# Patient Record
Sex: Female | Born: 1970 | Race: White | Hispanic: No | Marital: Married | Smoking: Former smoker
Health system: Southern US, Community
[De-identification: ages and names within clinical notes are randomized; demographics above are authoritative.]

## PROBLEM LIST (undated history)

## (undated) DIAGNOSIS — F988 Other specified behavioral and emotional disorders with onset usually occurring in childhood and adolescence: Secondary | ICD-10-CM

## (undated) DIAGNOSIS — E785 Hyperlipidemia, unspecified: Secondary | ICD-10-CM

## (undated) DIAGNOSIS — Z9221 Personal history of antineoplastic chemotherapy: Secondary | ICD-10-CM

## (undated) DIAGNOSIS — C50919 Malignant neoplasm of unspecified site of unspecified female breast: Secondary | ICD-10-CM

## (undated) DIAGNOSIS — Z923 Personal history of irradiation: Secondary | ICD-10-CM

## (undated) DIAGNOSIS — I10 Essential (primary) hypertension: Secondary | ICD-10-CM

## (undated) HISTORY — PX: ABDOMINAL HYSTERECTOMY: SHX81

## (undated) HISTORY — DX: Malignant neoplasm of unspecified site of unspecified female breast: C50.919

## (undated) HISTORY — DX: Essential (primary) hypertension: I10

## (undated) HISTORY — DX: Hyperlipidemia, unspecified: E78.5

## (undated) HISTORY — DX: Other specified behavioral and emotional disorders with onset usually occurring in childhood and adolescence: F98.8

---

## 2008-07-25 ENCOUNTER — Ambulatory Visit: Payer: Self-pay | Admitting: Obstetrics and Gynecology

## 2008-07-31 ENCOUNTER — Ambulatory Visit: Payer: Self-pay | Admitting: Obstetrics and Gynecology

## 2008-12-07 ENCOUNTER — Ambulatory Visit: Payer: Self-pay | Admitting: Obstetrics and Gynecology

## 2008-12-19 ENCOUNTER — Ambulatory Visit: Payer: Self-pay | Admitting: Obstetrics and Gynecology

## 2011-12-02 DIAGNOSIS — C50919 Malignant neoplasm of unspecified site of unspecified female breast: Secondary | ICD-10-CM

## 2011-12-02 HISTORY — DX: Malignant neoplasm of unspecified site of unspecified female breast: C50.919

## 2011-12-02 HISTORY — PX: BREAST BIOPSY: SHX20

## 2011-12-02 HISTORY — PX: BREAST LUMPECTOMY: SHX2

## 2012-11-01 ENCOUNTER — Ambulatory Visit: Payer: Self-pay | Admitting: Obstetrics and Gynecology

## 2012-11-22 ENCOUNTER — Ambulatory Visit: Payer: Self-pay | Admitting: Oncology

## 2012-11-30 ENCOUNTER — Ambulatory Visit: Payer: Self-pay | Admitting: Anesthesiology

## 2012-11-30 DIAGNOSIS — I10 Essential (primary) hypertension: Secondary | ICD-10-CM

## 2012-11-30 LAB — CBC
HCT: 44.6 % (ref 35.0–47.0)
HGB: 15.3 g/dL (ref 12.0–16.0)
MCV: 93 fL (ref 80–100)
RBC: 4.81 10*6/uL (ref 3.80–5.20)
WBC: 7.4 10*3/uL (ref 3.6–11.0)

## 2012-12-01 ENCOUNTER — Ambulatory Visit: Payer: Self-pay | Admitting: Oncology

## 2012-12-01 DIAGNOSIS — Z923 Personal history of irradiation: Secondary | ICD-10-CM

## 2012-12-01 DIAGNOSIS — Z9221 Personal history of antineoplastic chemotherapy: Secondary | ICD-10-CM

## 2012-12-01 HISTORY — DX: Personal history of irradiation: Z92.3

## 2012-12-01 HISTORY — DX: Personal history of antineoplastic chemotherapy: Z92.21

## 2012-12-06 ENCOUNTER — Ambulatory Visit: Payer: Self-pay | Admitting: Surgery

## 2012-12-10 LAB — CBC CANCER CENTER
Basophil #: 0.1 x10 3/mm (ref 0.0–0.1)
Eosinophil #: 0.1 x10 3/mm (ref 0.0–0.7)
Eosinophil %: 1.4 %
Lymphocyte #: 1.8 x10 3/mm (ref 1.0–3.6)
MCH: 31.1 pg (ref 26.0–34.0)
Neutrophil #: 5.9 x10 3/mm (ref 1.4–6.5)
Platelet: 248 x10 3/mm (ref 150–440)
RBC: 4.51 10*6/uL (ref 3.80–5.20)
RDW: 13.9 % (ref 11.5–14.5)

## 2012-12-10 LAB — COMPREHENSIVE METABOLIC PANEL
Alkaline Phosphatase: 66 U/L (ref 50–136)
Anion Gap: 7 (ref 7–16)
BUN: 10 mg/dL (ref 7–18)
Calcium, Total: 9.3 mg/dL (ref 8.5–10.1)
Creatinine: 0.75 mg/dL (ref 0.60–1.30)
EGFR (African American): >60
Potassium: 3.7 mmol/L (ref 3.5–5.1)
SGOT(AST): 11 U/L — ABNORMAL LOW (ref 15–37)
Sodium: 139 mmol/L (ref 136–145)
Total Protein: 7.1 g/dL (ref 6.4–8.2)

## 2012-12-17 LAB — COMPREHENSIVE METABOLIC PANEL
Albumin: 3.3 g/dL — ABNORMAL LOW (ref 3.4–5.0)
Alkaline Phosphatase: 89 U/L (ref 50–136)
BUN: 11 mg/dL (ref 7–18)
Bilirubin,Total: <0.1 mg/dL — ABNORMAL LOW (ref 0.2–1.0)
Calcium, Total: 8.5 mg/dL (ref 8.5–10.1)
Creatinine: 0.56 mg/dL — ABNORMAL LOW (ref 0.60–1.30)
EGFR (African American): >60
EGFR (Non-African Amer.): >60
SGOT(AST): 9 U/L — ABNORMAL LOW (ref 15–37)
SGPT (ALT): 17 U/L (ref 12–78)
Total Protein: 6.6 g/dL (ref 6.4–8.2)

## 2012-12-17 LAB — CBC CANCER CENTER
Bands: 2 %
HCT: 36.8 % (ref 35.0–47.0)
HGB: 12.7 g/dL (ref 12.0–16.0)
MCHC: 34.5 g/dL (ref 32.0–36.0)
Monocytes: 5 %
Myelocyte: 2 %
Other Cells Blood: 12 %
Platelet: 159 x10 3/mm (ref 150–440)
RBC: 3.96 10*6/uL (ref 3.80–5.20)
RDW: 13.5 % (ref 11.5–14.5)
WBC: 3.7 x10 3/mm (ref 3.6–11.0)

## 2012-12-24 LAB — CBC CANCER CENTER
Basophil #: 0.1 x10 3/mm (ref 0.0–0.1)
Eosinophil #: 0 x10 3/mm (ref 0.0–0.7)
HGB: 13.5 g/dL (ref 12.0–16.0)
MCH: 32.2 pg (ref 26.0–34.0)
MCHC: 35 g/dL (ref 32.0–36.0)
MCV: 92 fL (ref 80–100)
Monocyte #: 1.2 x10 3/mm — ABNORMAL HIGH (ref 0.2–0.9)
Neutrophil #: 8.2 x10 3/mm — ABNORMAL HIGH (ref 1.4–6.5)
Neutrophil %: 69.9 %
RBC: 4.21 10*6/uL (ref 3.80–5.20)
RDW: 13.7 % (ref 11.5–14.5)

## 2012-12-24 LAB — COMPREHENSIVE METABOLIC PANEL
BUN: 10 mg/dL (ref 7–18)
Bilirubin,Total: 0.1 mg/dL — ABNORMAL LOW (ref 0.2–1.0)
Chloride: 103 mmol/L (ref 98–107)
Co2: 28 mmol/L (ref 21–32)
Creatinine: 0.76 mg/dL (ref 0.60–1.30)
EGFR (Non-African Amer.): >60
Glucose: 106 mg/dL — ABNORMAL HIGH (ref 65–99)
Potassium: 3.2 mmol/L — ABNORMAL LOW (ref 3.5–5.1)
SGOT(AST): 14 U/L — ABNORMAL LOW (ref 15–37)

## 2012-12-31 LAB — CBC CANCER CENTER
Comment - H1-Com4: NORMAL
Eosinophil: 2 %
HGB: 11.8 g/dL — ABNORMAL LOW (ref 12.0–16.0)
Lymphocytes: 34 %
MCV: 93 fL (ref 80–100)
Monocytes: 6 %
Variant Lymphocyte: 3 %
WBC: 3.9 x10 3/mm (ref 3.6–11.0)

## 2013-01-01 ENCOUNTER — Ambulatory Visit: Payer: Self-pay | Admitting: Oncology

## 2013-01-07 LAB — CBC CANCER CENTER
Basophil #: 0 x10 3/mm (ref 0.0–0.1)
Eosinophil #: 0 x10 3/mm (ref 0.0–0.7)
Eosinophil %: 0.4 %
HCT: 36.9 % (ref 35.0–47.0)
HGB: 13.1 g/dL (ref 12.0–16.0)
Lymphocyte %: 19.1 %
MCH: 32.9 pg (ref 26.0–34.0)
MCV: 93 fL (ref 80–100)
Monocyte #: 1.6 x10 3/mm — ABNORMAL HIGH (ref 0.2–0.9)
Monocyte %: 13.4 %
Neutrophil #: 7.8 x10 3/mm — ABNORMAL HIGH (ref 1.4–6.5)
Platelet: 191 x10 3/mm (ref 150–440)
RDW: 14 % (ref 11.5–14.5)
WBC: 11.7 x10 3/mm — ABNORMAL HIGH (ref 3.6–11.0)

## 2013-01-07 LAB — COMPREHENSIVE METABOLIC PANEL
Albumin: 3.5 g/dL (ref 3.4–5.0)
Anion Gap: 8 (ref 7–16)
Bilirubin,Total: 0.2 mg/dL (ref 0.2–1.0)
Chloride: 103 mmol/L (ref 98–107)
Co2: 30 mmol/L (ref 21–32)
EGFR (African American): >60
Glucose: 99 mg/dL (ref 65–99)
Osmolality: 280 (ref 275–301)
Potassium: 3.5 mmol/L (ref 3.5–5.1)
SGPT (ALT): 28 U/L (ref 12–78)
Sodium: 141 mmol/L (ref 136–145)
Total Protein: 6.7 g/dL (ref 6.4–8.2)

## 2013-01-14 LAB — CBC CANCER CENTER
Basophil %: 1.1 %
Eosinophil %: 1.1 %
HGB: 10.5 g/dL — ABNORMAL LOW (ref 12.0–16.0)
Lymphocyte #: 1.3 x10 3/mm (ref 1.0–3.6)
MCH: 32.4 pg (ref 26.0–34.0)
MCV: 94 fL (ref 80–100)
Monocyte #: 0.4 x10 3/mm (ref 0.2–0.9)
Monocyte %: 6.1 %
Neutrophil #: 4.9 x10 3/mm (ref 1.4–6.5)
Neutrophil %: 72.8 %
RBC: 3.25 10*6/uL — ABNORMAL LOW (ref 3.80–5.20)
RDW: 14.3 % (ref 11.5–14.5)
WBC: 6.7 x10 3/mm (ref 3.6–11.0)

## 2013-01-21 LAB — CBC CANCER CENTER
Eosinophil #: 0.1 x10 3/mm (ref 0.0–0.7)
Eosinophil %: 0.6 %
HGB: 12.1 g/dL (ref 12.0–16.0)
MCH: 33.4 pg (ref 26.0–34.0)
MCV: 94 fL (ref 80–100)
Monocyte #: 1.3 x10 3/mm — ABNORMAL HIGH (ref 0.2–0.9)
Monocyte %: 14.8 %
Neutrophil #: 6 x10 3/mm (ref 1.4–6.5)
RDW: 16.6 % — ABNORMAL HIGH (ref 11.5–14.5)
WBC: 9 x10 3/mm (ref 3.6–11.0)

## 2013-01-21 LAB — COMPREHENSIVE METABOLIC PANEL
Albumin: 3.3 g/dL — ABNORMAL LOW (ref 3.4–5.0)
Alkaline Phosphatase: 93 U/L (ref 50–136)
BUN: 10 mg/dL (ref 7–18)
Bilirubin,Total: 0.2 mg/dL (ref 0.2–1.0)
Calcium, Total: 8.6 mg/dL (ref 8.5–10.1)
Co2: 30 mmol/L (ref 21–32)
Creatinine: 0.66 mg/dL (ref 0.60–1.30)
EGFR (Non-African Amer.): >60
Potassium: 4 mmol/L (ref 3.5–5.1)
SGOT(AST): 12 U/L — ABNORMAL LOW (ref 15–37)
SGPT (ALT): 29 U/L (ref 12–78)
Total Protein: 6.3 g/dL — ABNORMAL LOW (ref 6.4–8.2)

## 2013-01-28 LAB — CBC CANCER CENTER
Basophil #: 0.1 x10 3/mm (ref 0.0–0.1)
Eosinophil #: 0.1 x10 3/mm (ref 0.0–0.7)
Eosinophil %: 2.2 %
HCT: 31.6 % — ABNORMAL LOW (ref 35.0–47.0)
HGB: 11.1 g/dL — ABNORMAL LOW (ref 12.0–16.0)
Lymphocyte #: 0.9 x10 3/mm — ABNORMAL LOW (ref 1.0–3.6)
MCH: 33.2 pg (ref 26.0–34.0)
MCHC: 35.1 g/dL (ref 32.0–36.0)
Monocyte #: 0.5 x10 3/mm (ref 0.2–0.9)
Monocyte %: 15.4 %
Neutrophil %: 54.7 %
Platelet: 215 x10 3/mm (ref 150–440)
RBC: 3.34 10*6/uL — ABNORMAL LOW (ref 3.80–5.20)
WBC: 3.5 x10 3/mm — ABNORMAL LOW (ref 3.6–11.0)

## 2013-01-29 ENCOUNTER — Ambulatory Visit: Payer: Self-pay | Admitting: Oncology

## 2013-02-04 LAB — COMPREHENSIVE METABOLIC PANEL
Albumin: 3.7 g/dL (ref 3.4–5.0)
Alkaline Phosphatase: 106 U/L (ref 50–136)
Anion Gap: 9 (ref 7–16)
BUN: 16 mg/dL (ref 7–18)
Bilirubin,Total: 0.3 mg/dL (ref 0.2–1.0)
Chloride: 102 mmol/L (ref 98–107)
Co2: 30 mmol/L (ref 21–32)
EGFR (African American): >60
EGFR (Non-African Amer.): >60
Osmolality: 282 (ref 275–301)
Potassium: 3.9 mmol/L (ref 3.5–5.1)
SGOT(AST): 12 U/L — ABNORMAL LOW (ref 15–37)
Sodium: 141 mmol/L (ref 136–145)
Total Protein: 7.1 g/dL (ref 6.4–8.2)

## 2013-02-04 LAB — CBC CANCER CENTER
Basophil %: 0.4 %
Eosinophil #: 0.1 x10 3/mm (ref 0.0–0.7)
Eosinophil %: 0.8 %
HCT: 36.4 % (ref 35.0–47.0)
HGB: 12.7 g/dL (ref 12.0–16.0)
Lymphocyte #: 1.8 x10 3/mm (ref 1.0–3.6)
Lymphocyte %: 19.1 %
MCH: 33.2 pg (ref 26.0–34.0)
Monocyte #: 1.7 x10 3/mm — ABNORMAL HIGH (ref 0.2–0.9)
Neutrophil #: 6 x10 3/mm (ref 1.4–6.5)
Neutrophil %: 62 %
RDW: 17.8 % — ABNORMAL HIGH (ref 11.5–14.5)

## 2013-02-11 LAB — COMPREHENSIVE METABOLIC PANEL
Anion Gap: 3 — ABNORMAL LOW (ref 7–16)
Bilirubin,Total: 0.2 mg/dL (ref 0.2–1.0)
Chloride: 107 mmol/L (ref 98–107)
Co2: 31 mmol/L (ref 21–32)
Creatinine: 0.65 mg/dL (ref 0.60–1.30)
EGFR (Non-African Amer.): >60
Glucose: 95 mg/dL (ref 65–99)
SGOT(AST): 23 U/L (ref 15–37)
SGPT (ALT): 42 U/L (ref 12–78)
Sodium: 141 mmol/L (ref 136–145)
Total Protein: 6.7 g/dL (ref 6.4–8.2)

## 2013-02-11 LAB — CBC CANCER CENTER
Basophil #: 0.1 x10 3/mm (ref 0.0–0.1)
Basophil %: 1.9 %
HCT: 31.5 % — ABNORMAL LOW (ref 35.0–47.0)
Lymphocyte #: 1.1 x10 3/mm (ref 1.0–3.6)
MCV: 95 fL (ref 80–100)
Monocyte %: 16.4 %
RBC: 3.31 10*6/uL — ABNORMAL LOW (ref 3.80–5.20)

## 2013-02-18 LAB — CBC CANCER CENTER
Basophil #: 0.1 x10 3/mm (ref 0.0–0.1)
Basophil %: 1.2 %
Lymphocyte %: 22.2 %
MCHC: 35.4 g/dL (ref 32.0–36.0)
Monocyte #: 0.8 x10 3/mm (ref 0.2–0.9)
Neutrophil #: 3 x10 3/mm (ref 1.4–6.5)
Neutrophil %: 58 %
Platelet: 302 x10 3/mm (ref 150–440)
RBC: 3.39 10*6/uL — ABNORMAL LOW (ref 3.80–5.20)
RDW: 19.1 % — ABNORMAL HIGH (ref 11.5–14.5)

## 2013-02-18 LAB — COMPREHENSIVE METABOLIC PANEL
Albumin: 3.3 g/dL — ABNORMAL LOW (ref 3.4–5.0)
Alkaline Phosphatase: 77 U/L (ref 50–136)
Anion Gap: 8 (ref 7–16)
BUN: 15 mg/dL (ref 7–18)
Bilirubin,Total: 0.2 mg/dL (ref 0.2–1.0)
Calcium, Total: 8.8 mg/dL (ref 8.5–10.1)
Chloride: 104 mmol/L (ref 98–107)
Co2: 29 mmol/L (ref 21–32)
Creatinine: 0.81 mg/dL (ref 0.60–1.30)
EGFR (African American): >60
EGFR (Non-African Amer.): >60
SGOT(AST): 17 U/L (ref 15–37)

## 2013-02-25 LAB — CBC CANCER CENTER
Basophil #: 0 x10 3/mm (ref 0.0–0.1)
Basophil %: 0.8 %
Eosinophil %: 2.1 %
HCT: 31.5 % — ABNORMAL LOW (ref 35.0–47.0)
HGB: 11.1 g/dL — ABNORMAL LOW (ref 12.0–16.0)
Lymphocyte #: 1.2 x10 3/mm (ref 1.0–3.6)
MCH: 34.6 pg — ABNORMAL HIGH (ref 26.0–34.0)
MCHC: 35.1 g/dL (ref 32.0–36.0)
MCV: 99 fL (ref 80–100)
Monocyte #: 0.7 x10 3/mm (ref 0.2–0.9)
Monocyte %: 12.3 %
Neutrophil #: 3.7 x10 3/mm (ref 1.4–6.5)
Neutrophil %: 63.9 %
Platelet: 235 x10 3/mm (ref 150–440)
RBC: 3.2 10*6/uL — ABNORMAL LOW (ref 3.80–5.20)
RDW: 18.3 % — ABNORMAL HIGH (ref 11.5–14.5)
WBC: 5.8 x10 3/mm (ref 3.6–11.0)

## 2013-02-25 LAB — COMPREHENSIVE METABOLIC PANEL
Albumin: 3.3 g/dL — ABNORMAL LOW (ref 3.4–5.0)
Anion Gap: 5 — ABNORMAL LOW (ref 7–16)
Calcium, Total: 8.5 mg/dL (ref 8.5–10.1)
Chloride: 106 mmol/L (ref 98–107)
EGFR (Non-African Amer.): >60
Glucose: 94 mg/dL (ref 65–99)
Osmolality: 283 (ref 275–301)
Potassium: 3.7 mmol/L (ref 3.5–5.1)
SGOT(AST): 20 U/L (ref 15–37)
SGPT (ALT): 45 U/L (ref 12–78)
Sodium: 142 mmol/L (ref 136–145)
Total Protein: 6.4 g/dL (ref 6.4–8.2)

## 2013-03-01 ENCOUNTER — Ambulatory Visit: Payer: Self-pay | Admitting: Oncology

## 2013-03-04 LAB — COMPREHENSIVE METABOLIC PANEL
Albumin: 3.6 g/dL (ref 3.4–5.0)
Alkaline Phosphatase: 82 U/L (ref 50–136)
Anion Gap: 7 (ref 7–16)
Bilirubin,Total: 0.4 mg/dL (ref 0.2–1.0)
Calcium, Total: 9 mg/dL (ref 8.5–10.1)
Chloride: 100 mmol/L (ref 98–107)
Co2: 32 mmol/L (ref 21–32)
Creatinine: 0.75 mg/dL (ref 0.60–1.30)
EGFR (African American): >60
EGFR (Non-African Amer.): >60
Glucose: 105 mg/dL — ABNORMAL HIGH (ref 65–99)
Osmolality: 278 (ref 275–301)
Potassium: 3.3 mmol/L — ABNORMAL LOW (ref 3.5–5.1)
SGOT(AST): 21 U/L (ref 15–37)
Sodium: 139 mmol/L (ref 136–145)

## 2013-03-04 LAB — CBC CANCER CENTER
Basophil %: 0.9 %
Eosinophil %: 2 %
Lymphocyte #: 1.5 x10 3/mm (ref 1.0–3.6)
MCH: 35 pg — ABNORMAL HIGH (ref 26.0–34.0)
Monocyte #: 0.7 x10 3/mm (ref 0.2–0.9)
Monocyte %: 10.6 %
Neutrophil %: 63.4 %
Platelet: 281 x10 3/mm (ref 150–440)

## 2013-03-11 LAB — CBC CANCER CENTER
Basophil #: 0 x10 3/mm (ref 0.0–0.1)
Basophil %: 0.9 %
HCT: 31.8 % — ABNORMAL LOW (ref 35.0–47.0)
HGB: 11.2 g/dL — ABNORMAL LOW (ref 12.0–16.0)
Lymphocyte #: 1.2 x10 3/mm (ref 1.0–3.6)
MCHC: 35.2 g/dL (ref 32.0–36.0)
Monocyte #: 0.5 x10 3/mm (ref 0.2–0.9)
Monocyte %: 11.5 %
Neutrophil #: 2.6 x10 3/mm (ref 1.4–6.5)
Neutrophil %: 57.7 %
Platelet: 252 x10 3/mm (ref 150–440)
RBC: 3.15 10*6/uL — ABNORMAL LOW (ref 3.80–5.20)
RDW: 16.6 % — ABNORMAL HIGH (ref 11.5–14.5)
WBC: 4.5 x10 3/mm (ref 3.6–11.0)

## 2013-03-11 LAB — COMPREHENSIVE METABOLIC PANEL
Alkaline Phosphatase: 73 U/L (ref 50–136)
Anion Gap: 4 — ABNORMAL LOW (ref 7–16)
BUN: 8 mg/dL (ref 7–18)
Co2: 31 mmol/L (ref 21–32)
Creatinine: 0.7 mg/dL (ref 0.60–1.30)
EGFR (Non-African Amer.): >60
Osmolality: 281 (ref 275–301)
Potassium: 3.5 mmol/L (ref 3.5–5.1)
SGOT(AST): 18 U/L (ref 15–37)
SGPT (ALT): 36 U/L (ref 12–78)
Total Protein: 6.2 g/dL — ABNORMAL LOW (ref 6.4–8.2)

## 2013-03-17 LAB — CBC CANCER CENTER
Basophil #: 0 x10 3/mm (ref 0.0–0.1)
HCT: 33.1 % — ABNORMAL LOW (ref 35.0–47.0)
Lymphocyte %: 21.6 %
MCH: 36.1 pg — ABNORMAL HIGH (ref 26.0–34.0)
MCHC: 35.7 g/dL (ref 32.0–36.0)
MCV: 101 fL — ABNORMAL HIGH (ref 80–100)
Monocyte #: 0.5 x10 3/mm (ref 0.2–0.9)
Monocyte %: 10.3 %
Neutrophil %: 65.6 %
Platelet: 263 x10 3/mm (ref 150–440)
RBC: 3.28 10*6/uL — ABNORMAL LOW (ref 3.80–5.20)
RDW: 15.5 % — ABNORMAL HIGH (ref 11.5–14.5)
WBC: 5.3 x10 3/mm (ref 3.6–11.0)

## 2013-03-17 LAB — COMPREHENSIVE METABOLIC PANEL
Alkaline Phosphatase: 80 U/L (ref 50–136)
Anion Gap: 6 — ABNORMAL LOW (ref 7–16)
Calcium, Total: 9.2 mg/dL (ref 8.5–10.1)
Chloride: 103 mmol/L (ref 98–107)
EGFR (African American): >60
EGFR (Non-African Amer.): >60
Glucose: 122 mg/dL — ABNORMAL HIGH (ref 65–99)
Osmolality: 281 (ref 275–301)
SGOT(AST): 23 U/L (ref 15–37)

## 2013-03-25 LAB — COMPREHENSIVE METABOLIC PANEL
Alkaline Phosphatase: 84 U/L (ref 50–136)
Anion Gap: 4 — ABNORMAL LOW (ref 7–16)
Calcium, Total: 9 mg/dL (ref 8.5–10.1)
Co2: 32 mmol/L (ref 21–32)
Creatinine: 0.79 mg/dL (ref 0.60–1.30)
EGFR (African American): >60
EGFR (Non-African Amer.): >60
Potassium: 3.7 mmol/L (ref 3.5–5.1)
SGOT(AST): 19 U/L (ref 15–37)
SGPT (ALT): 41 U/L (ref 12–78)
Sodium: 141 mmol/L (ref 136–145)
Total Protein: 6.4 g/dL (ref 6.4–8.2)

## 2013-03-25 LAB — CBC CANCER CENTER
Basophil #: 0 x10 3/mm (ref 0.0–0.1)
Basophil %: 0.6 %
Eosinophil #: 0.1 x10 3/mm (ref 0.0–0.7)
Eosinophil %: 1.3 %
HGB: 11.2 g/dL — ABNORMAL LOW (ref 12.0–16.0)
Lymphocyte #: 1.4 x10 3/mm (ref 1.0–3.6)
Lymphocyte %: 23.8 %
MCH: 35.7 pg — ABNORMAL HIGH (ref 26.0–34.0)
MCHC: 34.7 g/dL (ref 32.0–36.0)
MCV: 103 fL — ABNORMAL HIGH (ref 80–100)
Neutrophil #: 3.8 x10 3/mm (ref 1.4–6.5)
Neutrophil %: 64 %
WBC: 6 x10 3/mm (ref 3.6–11.0)

## 2013-03-31 ENCOUNTER — Ambulatory Visit: Payer: Self-pay | Admitting: Oncology

## 2013-04-01 LAB — COMPREHENSIVE METABOLIC PANEL
Alkaline Phosphatase: 76 U/L (ref 50–136)
Anion Gap: 10 (ref 7–16)
Calcium, Total: 8.9 mg/dL (ref 8.5–10.1)
Chloride: 103 mmol/L (ref 98–107)
Co2: 29 mmol/L (ref 21–32)
Creatinine: 0.66 mg/dL (ref 0.60–1.30)
EGFR (Non-African Amer.): >60
Potassium: 3.4 mmol/L — ABNORMAL LOW (ref 3.5–5.1)
SGOT(AST): 16 U/L (ref 15–37)
SGPT (ALT): 35 U/L (ref 12–78)
Sodium: 142 mmol/L (ref 136–145)

## 2013-04-01 LAB — CBC CANCER CENTER
Basophil #: 0 x10 3/mm (ref 0.0–0.1)
Eosinophil #: 0.1 x10 3/mm (ref 0.0–0.7)
HCT: 34.5 % — ABNORMAL LOW (ref 35.0–47.0)
HGB: 11.8 g/dL — ABNORMAL LOW (ref 12.0–16.0)
Lymphocyte #: 1.2 x10 3/mm (ref 1.0–3.6)
MCV: 104 fL — ABNORMAL HIGH (ref 80–100)
Monocyte %: 9.9 %
Neutrophil %: 59.5 %
RBC: 3.32 10*6/uL — ABNORMAL LOW (ref 3.80–5.20)
RDW: 14.2 % (ref 11.5–14.5)
WBC: 4.6 x10 3/mm (ref 3.6–11.0)

## 2013-04-08 LAB — CBC CANCER CENTER
Basophil #: 0.1 x10 3/mm (ref 0.0–0.1)
Basophil %: 1 %
Eosinophil #: 0.1 x10 3/mm (ref 0.0–0.7)
Eosinophil %: 2.3 %
HCT: 37 % (ref 35.0–47.0)
HGB: 13.3 g/dL (ref 12.0–16.0)
Lymphocyte %: 25.9 %
MCH: 36.7 pg — ABNORMAL HIGH (ref 26.0–34.0)
MCV: 102 fL — ABNORMAL HIGH (ref 80–100)
Monocyte #: 0.7 x10 3/mm (ref 0.2–0.9)
Monocyte %: 11.7 %
Platelet: 281 x10 3/mm (ref 150–440)
RBC: 3.62 10*6/uL — ABNORMAL LOW (ref 3.80–5.20)
RDW: 14.2 % (ref 11.5–14.5)
WBC: 5.8 x10 3/mm (ref 3.6–11.0)

## 2013-04-08 LAB — COMPREHENSIVE METABOLIC PANEL
Anion Gap: 7 (ref 7–16)
Bilirubin,Total: 0.4 mg/dL (ref 0.2–1.0)
Calcium, Total: 9.6 mg/dL (ref 8.5–10.1)
Chloride: 103 mmol/L (ref 98–107)
Co2: 31 mmol/L (ref 21–32)
Creatinine: 0.71 mg/dL (ref 0.60–1.30)
EGFR (African American): >60
EGFR (Non-African Amer.): >60
Osmolality: 280 (ref 275–301)
Potassium: 3.6 mmol/L (ref 3.5–5.1)
SGOT(AST): 20 U/L (ref 15–37)
Total Protein: 6.9 g/dL (ref 6.4–8.2)

## 2013-04-15 LAB — COMPREHENSIVE METABOLIC PANEL
Albumin: 3 g/dL — ABNORMAL LOW (ref 3.4–5.0)
Alkaline Phosphatase: 79 U/L (ref 50–136)
Bilirubin,Total: 0.2 mg/dL (ref 0.2–1.0)
Calcium, Total: 8.6 mg/dL (ref 8.5–10.1)
Chloride: 105 mmol/L (ref 98–107)
Co2: 31 mmol/L (ref 21–32)
Creatinine: 0.79 mg/dL (ref 0.60–1.30)
EGFR (Non-African Amer.): >60
Glucose: 124 mg/dL — ABNORMAL HIGH (ref 65–99)
Osmolality: 285 (ref 275–301)
Sodium: 142 mmol/L (ref 136–145)

## 2013-04-15 LAB — CBC CANCER CENTER
Basophil #: 0 x10 3/mm (ref 0.0–0.1)
Eosinophil #: 0.1 x10 3/mm (ref 0.0–0.7)
Eosinophil %: 2 %
HCT: 33.9 % — ABNORMAL LOW (ref 35.0–47.0)
Lymphocyte #: 1.2 x10 3/mm (ref 1.0–3.6)
Lymphocyte %: 27.8 %
MCH: 36 pg — ABNORMAL HIGH (ref 26.0–34.0)
MCHC: 35.2 g/dL (ref 32.0–36.0)
Monocyte #: 0.5 x10 3/mm (ref 0.2–0.9)
Neutrophil #: 2.6 x10 3/mm (ref 1.4–6.5)
Neutrophil %: 58.1 %
Platelet: 216 x10 3/mm (ref 150–440)
RBC: 3.3 10*6/uL — ABNORMAL LOW (ref 3.80–5.20)
RDW: 14 % (ref 11.5–14.5)
WBC: 4.4 x10 3/mm (ref 3.6–11.0)

## 2013-04-22 LAB — COMPREHENSIVE METABOLIC PANEL
Albumin: 3.2 g/dL — ABNORMAL LOW (ref 3.4–5.0)
BUN: 13 mg/dL (ref 7–18)
Calcium, Total: 9.3 mg/dL (ref 8.5–10.1)
Chloride: 103 mmol/L (ref 98–107)
Creatinine: 0.75 mg/dL (ref 0.60–1.30)
EGFR (African American): >60
EGFR (Non-African Amer.): >60
Osmolality: 282 (ref 275–301)
Potassium: 3.8 mmol/L (ref 3.5–5.1)
SGPT (ALT): 59 U/L (ref 12–78)
Sodium: 141 mmol/L (ref 136–145)
Total Protein: 6.3 g/dL — ABNORMAL LOW (ref 6.4–8.2)

## 2013-04-22 LAB — CBC CANCER CENTER
Basophil #: 0.1 x10 3/mm (ref 0.0–0.1)
Basophil %: 0.8 %
HCT: 35.9 % (ref 35.0–47.0)
HGB: 12.6 g/dL (ref 12.0–16.0)
Lymphocyte #: 1.3 x10 3/mm (ref 1.0–3.6)
MCH: 36 pg — ABNORMAL HIGH (ref 26.0–34.0)
MCHC: 35 g/dL (ref 32.0–36.0)
MCV: 103 fL — ABNORMAL HIGH (ref 80–100)
Monocyte #: 0.6 x10 3/mm (ref 0.2–0.9)
Neutrophil %: 67.3 %
Platelet: 280 x10 3/mm (ref 150–440)
RDW: 13.9 % (ref 11.5–14.5)
WBC: 6.3 x10 3/mm (ref 3.6–11.0)

## 2013-04-27 ENCOUNTER — Ambulatory Visit: Payer: Self-pay | Admitting: Surgery

## 2013-05-01 ENCOUNTER — Ambulatory Visit: Payer: Self-pay | Admitting: Oncology

## 2013-05-13 LAB — CBC CANCER CENTER
HCT: 38.7 % (ref 35.0–47.0)
MCHC: 35.5 g/dL (ref 32.0–36.0)
MCV: 101 fL — ABNORMAL HIGH (ref 80–100)
Monocyte #: 1 x10 3/mm — ABNORMAL HIGH (ref 0.2–0.9)
Monocyte %: 13.7 %
Neutrophil %: 59.2 %
Platelet: 299 x10 3/mm (ref 150–440)
WBC: 7.2 x10 3/mm (ref 3.6–11.0)

## 2013-05-13 LAB — COMPREHENSIVE METABOLIC PANEL
Albumin: 3.6 g/dL (ref 3.4–5.0)
BUN: 9 mg/dL (ref 7–18)
Bilirubin,Total: 0.2 mg/dL (ref 0.2–1.0)
Calcium, Total: 9.9 mg/dL (ref 8.5–10.1)
Chloride: 102 mmol/L (ref 98–107)
Creatinine: 0.76 mg/dL (ref 0.60–1.30)
EGFR (Non-African Amer.): >60
Osmolality: 274 (ref 275–301)
Potassium: 3.5 mmol/L (ref 3.5–5.1)
SGOT(AST): 19 U/L (ref 15–37)
Sodium: 138 mmol/L (ref 136–145)

## 2013-05-26 ENCOUNTER — Other Ambulatory Visit: Payer: Self-pay | Admitting: Oncology

## 2013-05-26 DIAGNOSIS — R922 Inconclusive mammogram: Secondary | ICD-10-CM

## 2013-05-29 ENCOUNTER — Ambulatory Visit
Admission: RE | Admit: 2013-05-29 | Discharge: 2013-05-29 | Disposition: A | Discharge status: Home / Self Care | Payer: 59 | Source: Ambulatory Visit | Attending: Oncology | Admitting: Oncology

## 2013-05-29 DIAGNOSIS — R922 Inconclusive mammogram: Secondary | ICD-10-CM

## 2013-05-29 MED ORDER — GADOBENATE DIMEGLUMINE 529 MG/ML IV SOLN
15.0000 mL | Freq: Once | INTRAVENOUS | Status: AC | PRN
  Administered 2013-05-29: 15 mL via INTRAVENOUS

## 2013-05-31 ENCOUNTER — Ambulatory Visit: Payer: Self-pay | Admitting: Oncology

## 2013-06-01 ENCOUNTER — Other Ambulatory Visit: Payer: 59

## 2013-06-13 ENCOUNTER — Ambulatory Visit: Payer: Self-pay | Admitting: Surgery

## 2013-07-01 ENCOUNTER — Ambulatory Visit: Payer: Self-pay | Admitting: Oncology

## 2013-08-01 ENCOUNTER — Ambulatory Visit: Payer: Self-pay | Admitting: Oncology

## 2013-08-05 LAB — BASIC METABOLIC PANEL
BUN: 10 mg/dL (ref 7–18)
Calcium, Total: 9.4 mg/dL (ref 8.5–10.1)
Chloride: 101 mmol/L (ref 98–107)
Co2: 31 mmol/L (ref 21–32)
Glucose: 88 mg/dL (ref 65–99)
Osmolality: 278 (ref 275–301)
Sodium: 140 mmol/L (ref 136–145)

## 2013-08-05 LAB — CBC CANCER CENTER
Basophil #: 0 x10 3/mm (ref 0.0–0.1)
Eosinophil #: 0.1 x10 3/mm (ref 0.0–0.7)
HGB: 14.4 g/dL (ref 12.0–16.0)
Lymphocyte #: 1.9 x10 3/mm (ref 1.0–3.6)
Lymphocyte %: 24.1 %
MCH: 33 pg (ref 26.0–34.0)
Monocyte #: 1 x10 3/mm — ABNORMAL HIGH (ref 0.2–0.9)
Platelet: 276 x10 3/mm (ref 150–440)
RBC: 4.36 10*6/uL (ref 3.80–5.20)
WBC: 8 x10 3/mm (ref 3.6–11.0)

## 2013-08-26 LAB — CBC CANCER CENTER
Basophil #: 0 x10 3/mm (ref 0.0–0.1)
Basophil %: 0.6 %
Eosinophil #: 0.1 x10 3/mm (ref 0.0–0.7)
HCT: 40.1 % (ref 35.0–47.0)
HGB: 14 g/dL (ref 12.0–16.0)
Lymphocyte #: 1.4 x10 3/mm (ref 1.0–3.6)
MCH: 32.8 pg (ref 26.0–34.0)
MCV: 94 fL (ref 80–100)
Monocyte %: 11.7 %
Neutrophil #: 4.3 x10 3/mm (ref 1.4–6.5)
Platelet: 263 x10 3/mm (ref 150–440)
RDW: 12.9 % (ref 11.5–14.5)

## 2013-08-31 ENCOUNTER — Ambulatory Visit: Payer: Self-pay | Admitting: Oncology

## 2013-09-16 LAB — COMPREHENSIVE METABOLIC PANEL
Albumin: 3.8 g/dL (ref 3.4–5.0)
Alkaline Phosphatase: 104 U/L (ref 50–136)
Anion Gap: 8 (ref 7–16)
Bilirubin,Total: 0.4 mg/dL (ref 0.2–1.0)
Calcium, Total: 8.9 mg/dL (ref 8.5–10.1)
Chloride: 100 mmol/L (ref 98–107)
Co2: 29 mmol/L (ref 21–32)
Creatinine: 0.67 mg/dL (ref 0.60–1.30)
EGFR (Non-African Amer.): >60
Glucose: 97 mg/dL (ref 65–99)
Osmolality: 273 (ref 275–301)
Potassium: 3.7 mmol/L (ref 3.5–5.1)
SGOT(AST): 18 U/L (ref 15–37)
Sodium: 137 mmol/L (ref 136–145)
Total Protein: 7.2 g/dL (ref 6.4–8.2)

## 2013-09-16 LAB — CBC CANCER CENTER
Basophil #: 0.1 x10 3/mm (ref 0.0–0.1)
Basophil %: 0.7 %
Eosinophil %: 1.4 %
HCT: 45.2 % (ref 35.0–47.0)
Lymphocyte #: 1.7 x10 3/mm (ref 1.0–3.6)
Lymphocyte %: 16.6 %
MCH: 33.3 pg (ref 26.0–34.0)
MCHC: 35.1 g/dL (ref 32.0–36.0)
MCV: 95 fL (ref 80–100)
Neutrophil #: 7 x10 3/mm — ABNORMAL HIGH (ref 1.4–6.5)
Neutrophil %: 68.5 %
RBC: 4.78 10*6/uL (ref 3.80–5.20)
RDW: 12.8 % (ref 11.5–14.5)
WBC: 10.3 x10 3/mm (ref 3.6–11.0)

## 2013-10-01 ENCOUNTER — Ambulatory Visit: Payer: Self-pay | Admitting: Oncology

## 2013-10-31 ENCOUNTER — Ambulatory Visit: Payer: Self-pay | Admitting: Oncology

## 2013-12-01 ENCOUNTER — Ambulatory Visit: Payer: Self-pay | Admitting: Oncology

## 2013-12-09 LAB — COMPREHENSIVE METABOLIC PANEL
ALBUMIN: 3.7 g/dL (ref 3.4–5.0)
ALT: 25 U/L (ref 12–78)
AST: 18 U/L (ref 15–37)
Alkaline Phosphatase: 66 U/L
Anion Gap: 9 (ref 7–16)
BUN: 12 mg/dL (ref 7–18)
Bilirubin,Total: 0.5 mg/dL (ref 0.2–1.0)
CHLORIDE: 99 mmol/L (ref 98–107)
Calcium, Total: 9.2 mg/dL (ref 8.5–10.1)
Co2: 28 mmol/L (ref 21–32)
Creatinine: 0.69 mg/dL (ref 0.60–1.30)
EGFR (African American): >60
EGFR (Non-African Amer.): >60
Glucose: 107 mg/dL — ABNORMAL HIGH (ref 65–99)
Osmolality: 272 (ref 275–301)
Potassium: 3.1 mmol/L — ABNORMAL LOW (ref 3.5–5.1)
SODIUM: 136 mmol/L (ref 136–145)
Total Protein: 6.9 g/dL (ref 6.4–8.2)

## 2013-12-09 LAB — CBC CANCER CENTER
Basophil #: 0 x10 3/mm (ref 0.0–0.1)
Basophil %: 0.2 %
Eosinophil #: 0.1 x10 3/mm (ref 0.0–0.7)
Eosinophil %: 0.7 %
HCT: 40.4 % (ref 35.0–47.0)
HGB: 13.9 g/dL (ref 12.0–16.0)
LYMPHS PCT: 27.4 %
Lymphocyte #: 2 x10 3/mm (ref 1.0–3.6)
MCH: 32.1 pg (ref 26.0–34.0)
MCHC: 34.5 g/dL (ref 32.0–36.0)
MCV: 93 fL (ref 80–100)
MONO ABS: 0.8 x10 3/mm (ref 0.2–0.9)
Monocyte %: 10.5 %
NEUTROS PCT: 61.2 %
Neutrophil #: 4.4 x10 3/mm (ref 1.4–6.5)
Platelet: 232 x10 3/mm (ref 150–440)
RBC: 4.34 10*6/uL (ref 3.80–5.20)
RDW: 12.6 % (ref 11.5–14.5)
WBC: 7.2 x10 3/mm (ref 3.6–11.0)

## 2013-12-13 ENCOUNTER — Ambulatory Visit: Payer: Self-pay | Admitting: Surgery

## 2014-01-01 ENCOUNTER — Ambulatory Visit: Payer: Self-pay | Admitting: Oncology

## 2014-01-13 DIAGNOSIS — Z853 Personal history of malignant neoplasm of breast: Secondary | ICD-10-CM | POA: Insufficient documentation

## 2014-01-13 DIAGNOSIS — I10 Essential (primary) hypertension: Secondary | ICD-10-CM | POA: Insufficient documentation

## 2014-01-13 DIAGNOSIS — F988 Other specified behavioral and emotional disorders with onset usually occurring in childhood and adolescence: Secondary | ICD-10-CM | POA: Insufficient documentation

## 2014-01-13 DIAGNOSIS — R61 Generalized hyperhidrosis: Secondary | ICD-10-CM | POA: Insufficient documentation

## 2014-01-29 ENCOUNTER — Ambulatory Visit: Payer: Self-pay | Admitting: Oncology

## 2014-03-01 ENCOUNTER — Ambulatory Visit: Payer: Self-pay | Admitting: Oncology

## 2014-03-31 ENCOUNTER — Ambulatory Visit: Payer: Self-pay | Admitting: Oncology

## 2014-05-01 ENCOUNTER — Ambulatory Visit: Payer: Self-pay | Admitting: Oncology

## 2014-05-11 ENCOUNTER — Ambulatory Visit: Payer: Self-pay | Admitting: Oncology

## 2014-05-12 LAB — CANCER ANTIGEN 27.29: CA 27.29: 21.4 U/mL (ref 0.0–38.6)

## 2014-05-31 ENCOUNTER — Ambulatory Visit: Payer: Self-pay | Admitting: Oncology

## 2014-08-10 ENCOUNTER — Ambulatory Visit: Payer: Self-pay | Admitting: Oncology

## 2014-08-14 LAB — CANCER ANTIGEN 27.29: CA 27.29: 9.7 U/mL (ref 0.0–38.6)

## 2014-08-29 IMAGING — US ULTRASOUND RIGHT BREAST
1 series · 14 of 14 positions shown · non-contrast
Comparison: none

REASON FOR EXAM: RT BR MASS 9 OCLOCK AND YRLY
COMMENTS:

[Series 1: ultrasound right breast · 0.08mm/px · 14 acquisitions, 14 frames shown]
[im 1/14]
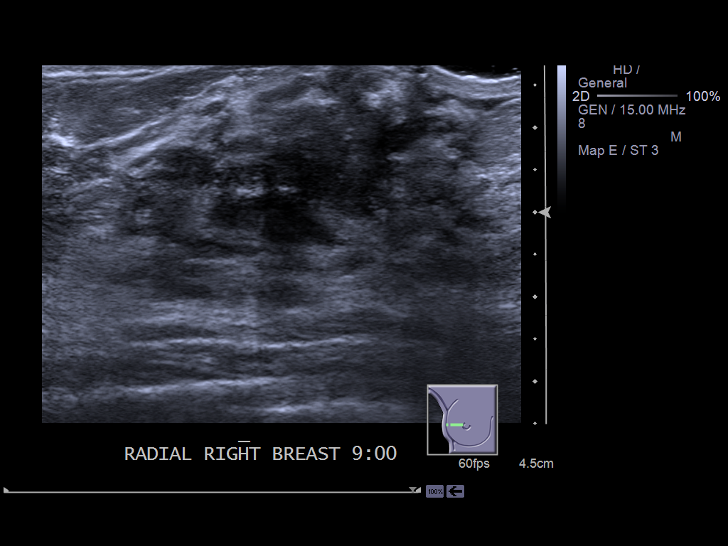
[im 2/14]
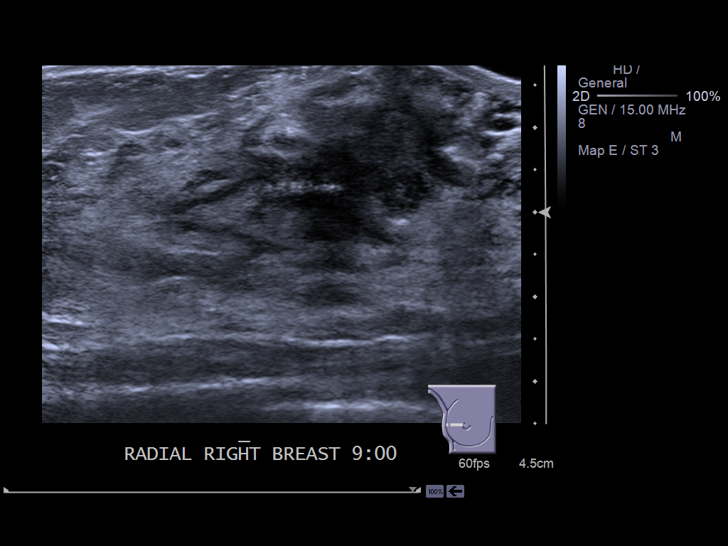
[im 3/14]
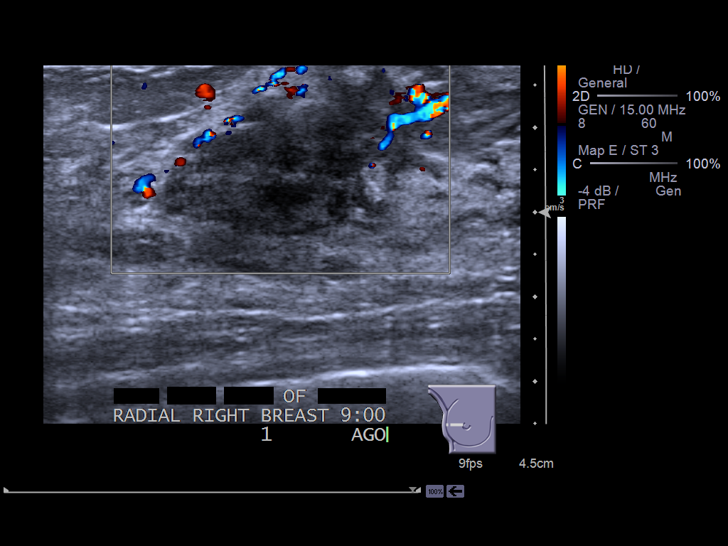
[im 4/14]
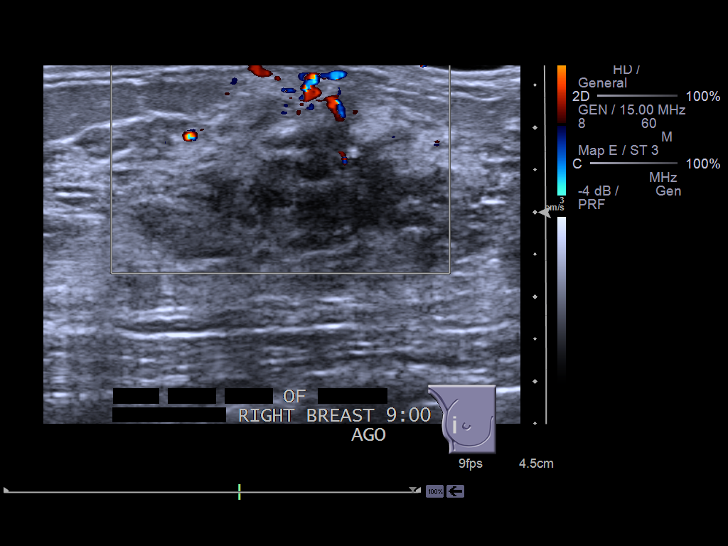
[im 5/14]
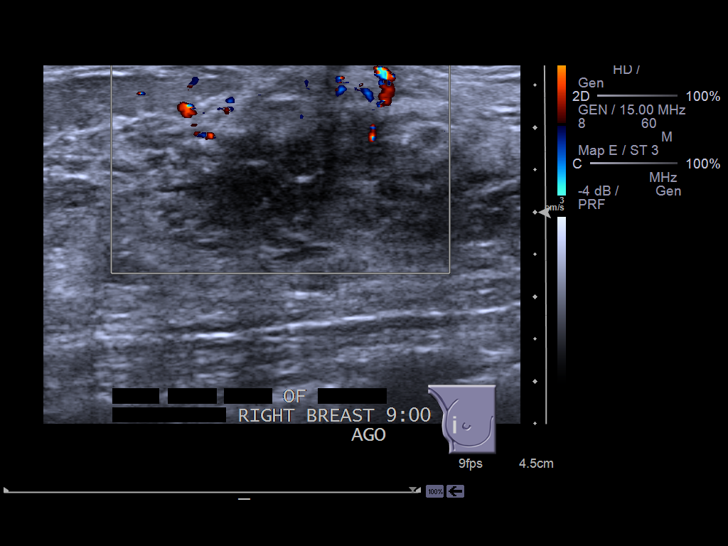
[im 6/14]
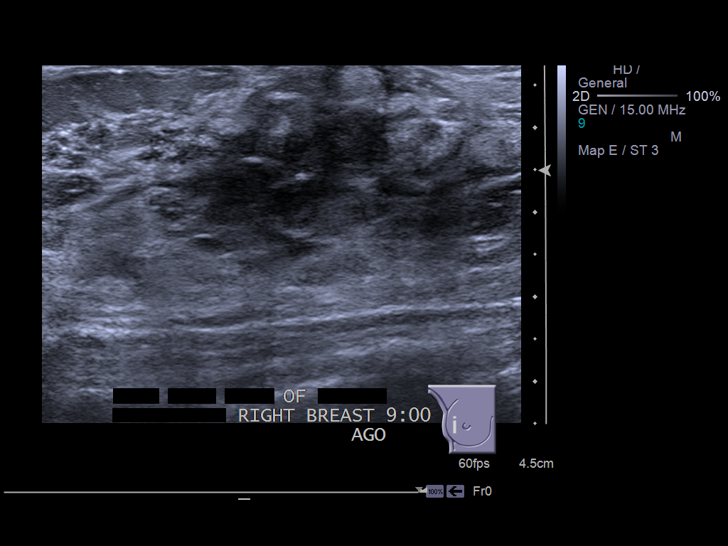
[im 7/14]
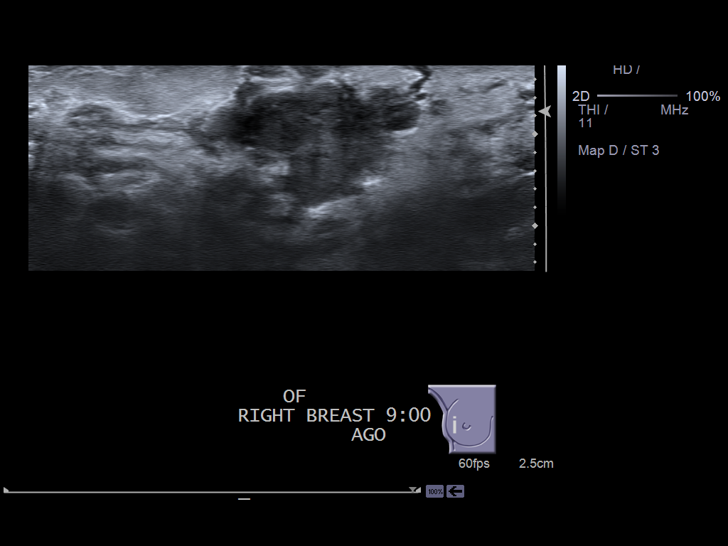
[im 8/14]
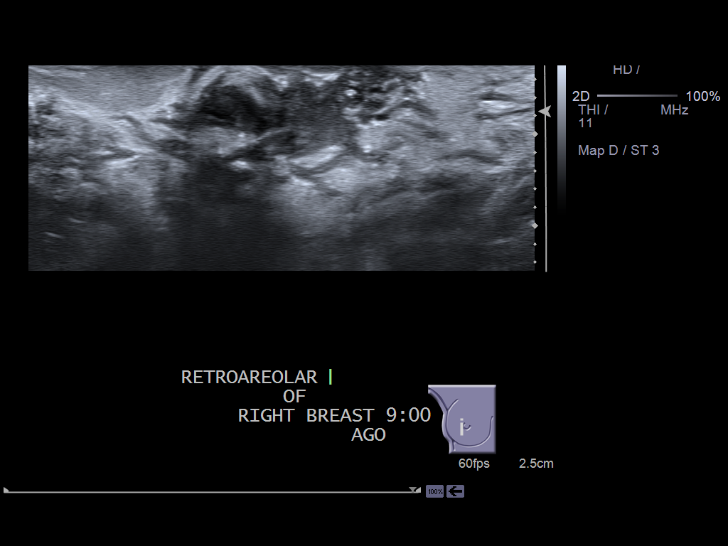
[im 9/14]
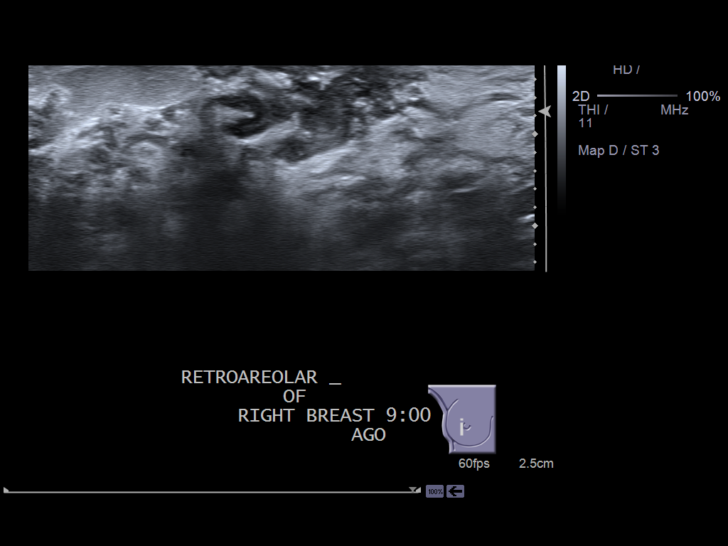
[im 10/14]
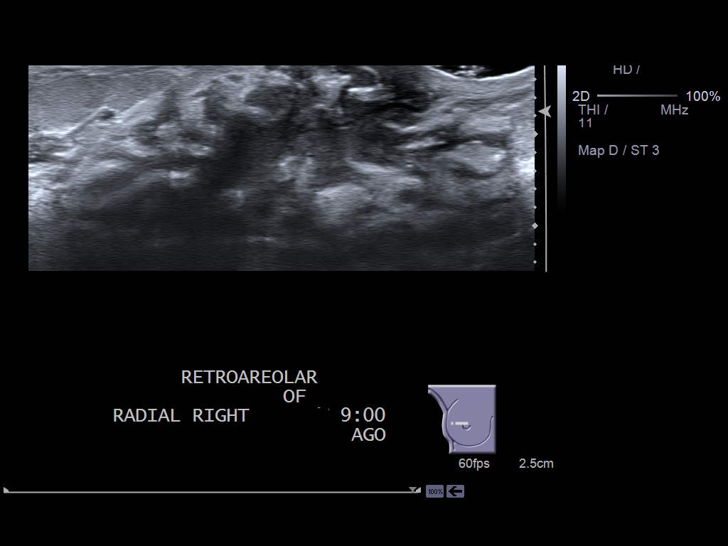
[im 11/14]
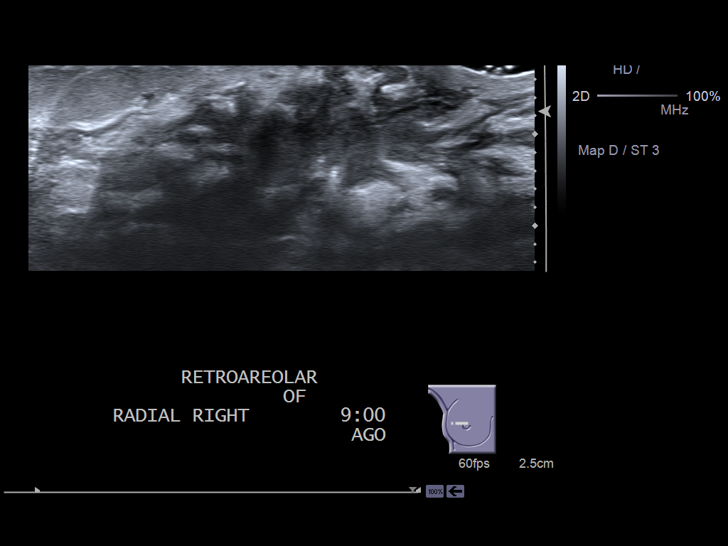
[im 12/14]
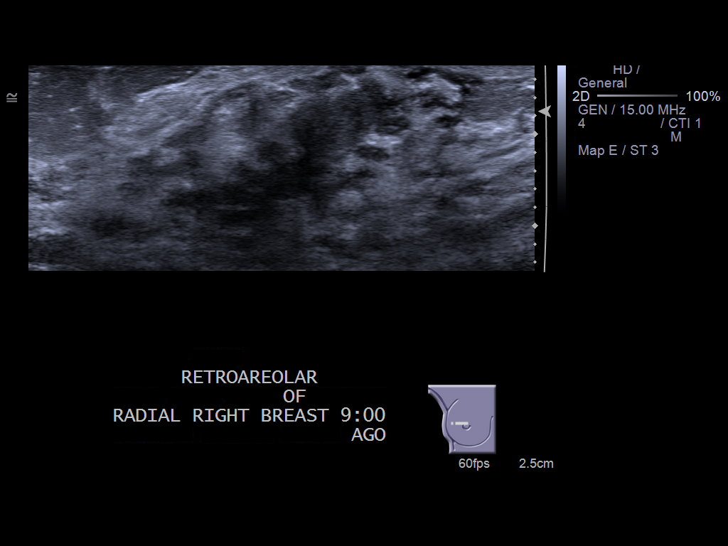
[im 13/14]
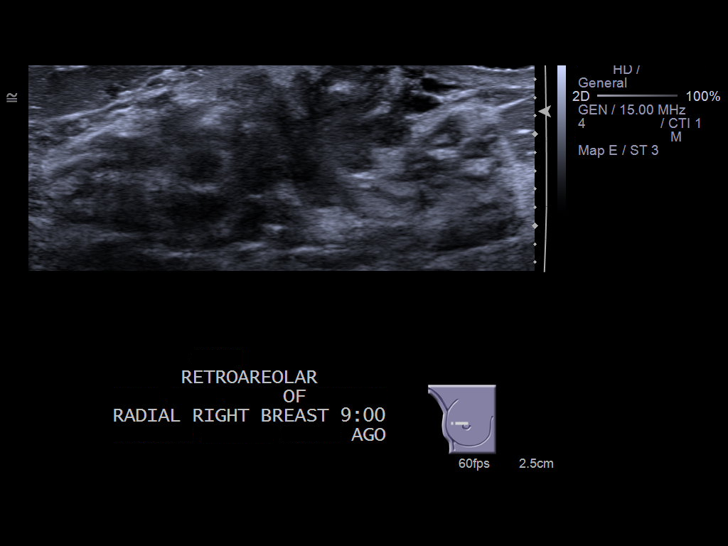
[im 14/14]
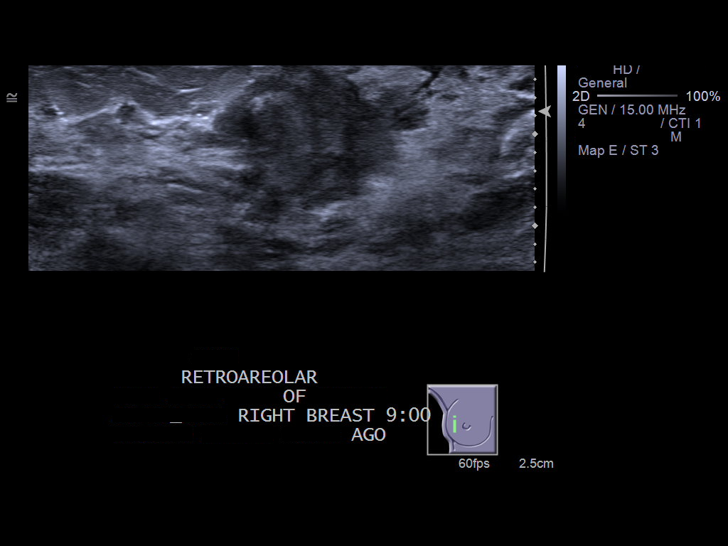

[14 of 14 positions shown; findings below may reference images not displayed]

PROCEDURE:     US  - US BREAST RIGHT  - November 01, 2012 [DATE]

RESULT:     Ultrasound of the right breast reveals an ill-defined region of
heterogeneous hypoechoic tissue at [DATE] in the right breast. The patient was
reportedly injured in this region. This could represent a hematoma.
Possibility of breast cancer cannot be excluded. Surgical evaluation is
suggested.A followup exam in a as needed. Mammogram obtained reveals no
focal mass lesion or clustered calcification.
IMPRESSION: Ill-defined approximately 2.5 cm parenchymal density in the
outer aspect of the right breast seen on ultrasound. This by history is at a
site of injury and this lesion could represent hematoma. Malignancy cannot
be excluded and surgical consultation suggested.

BI-RADS: Category 4 - Suspicious Abnormality - Biopsy Should Be Considered

A NEGATIVE MAMMOGRAM REPORT DOES NOT PRECLUDE BIOPSY OR OTHER EVALUATION OF
A CLINICALLY PALPABLE OR OTHERWISE SUSPICIOUS MASS OR LESION. BREAST CANCER
MAY NOT BE DETECTED IN UP TO 10% OF CASES.

## 2014-08-29 IMAGING — MG MM CAD DIAGNOSTIC MAMMO
1 series · 8 of 8 positions shown · non-contrast
Comparison: none

REASON FOR EXAM: RT BR MASS 9 OCLOCK AND YRLY
COMMENTS:

[R CC · right · 8 of 8 slices shown]
[im 1/8]
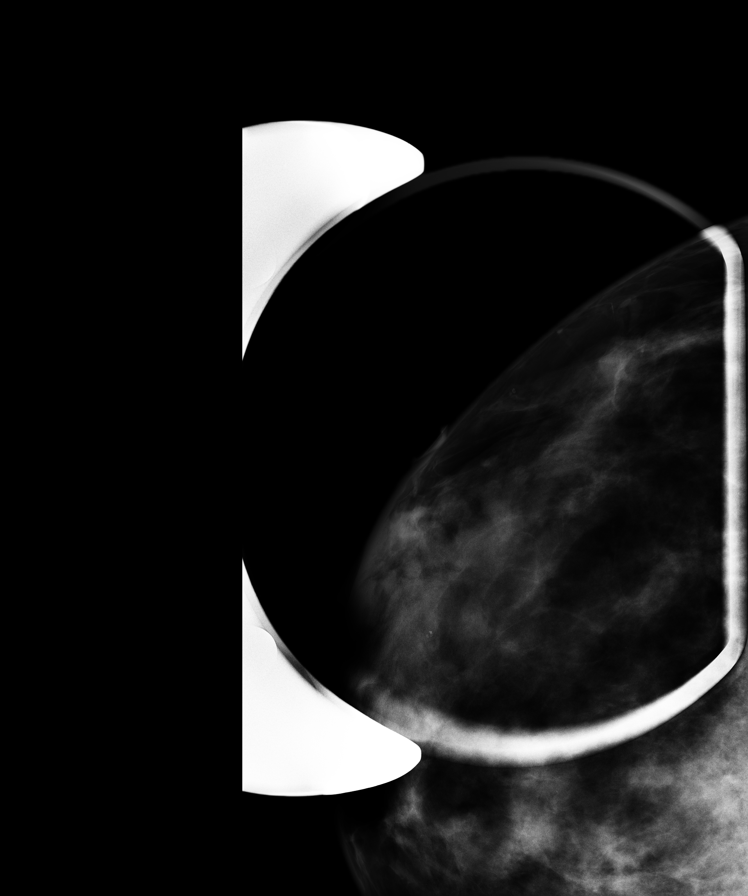
[im 2/8]
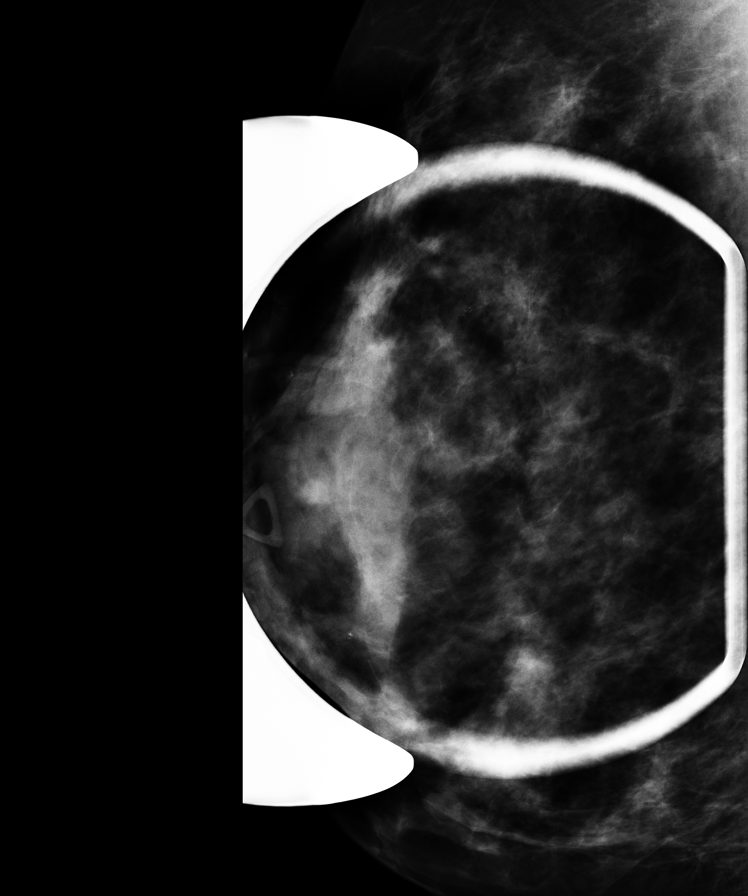
[im 3/8]
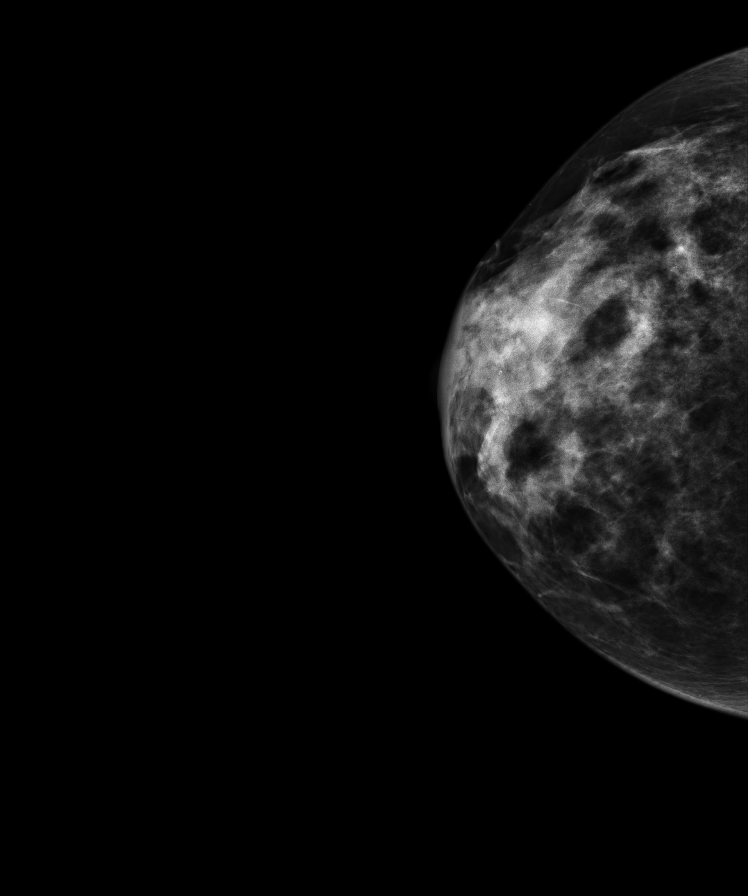
[im 4/8]
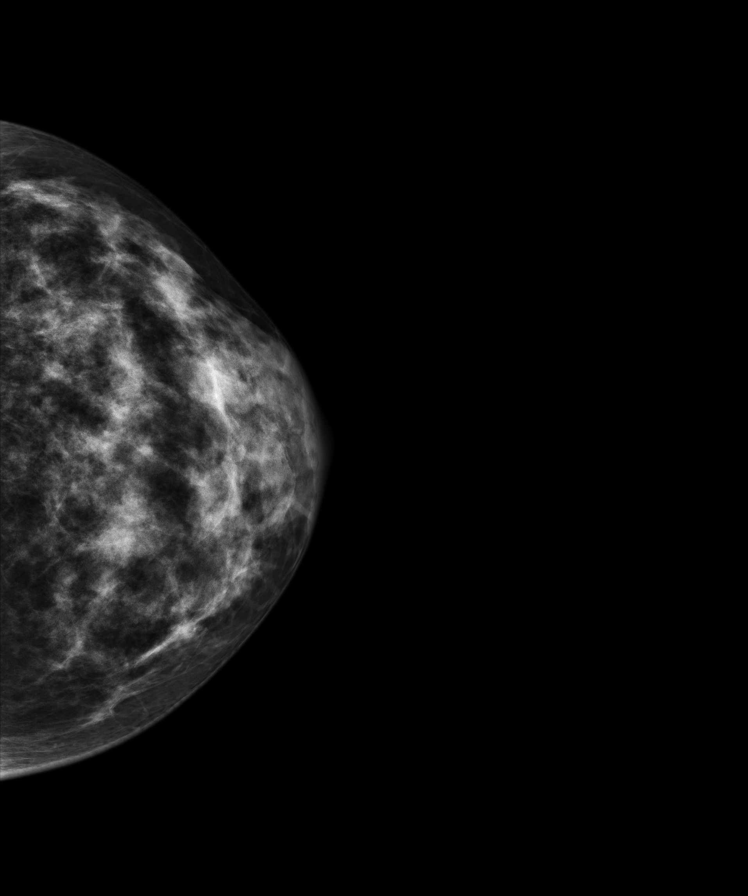
[im 5/8]
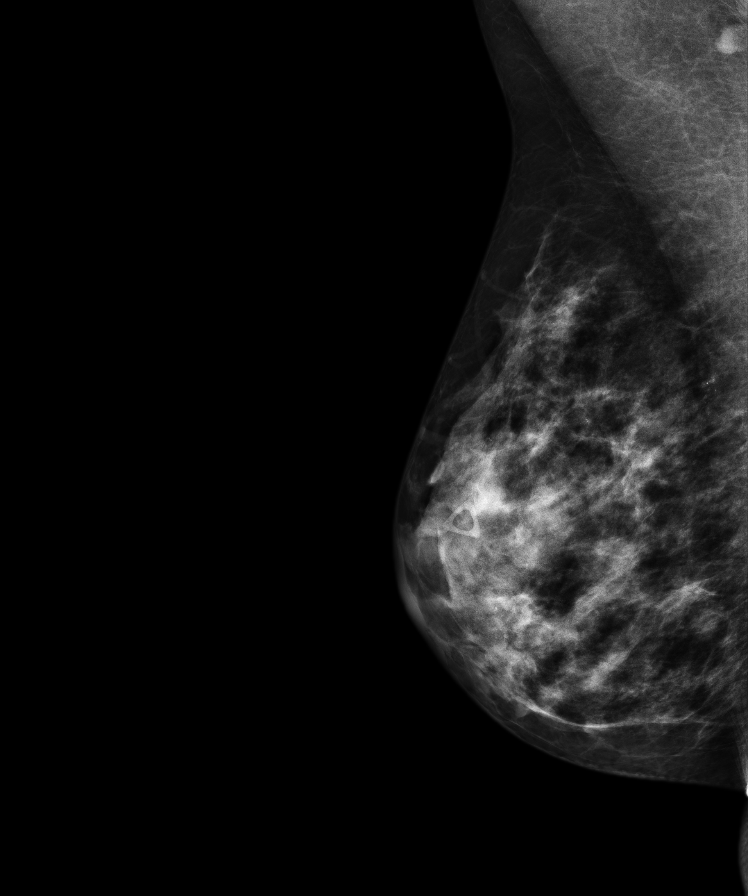
[im 6/8]
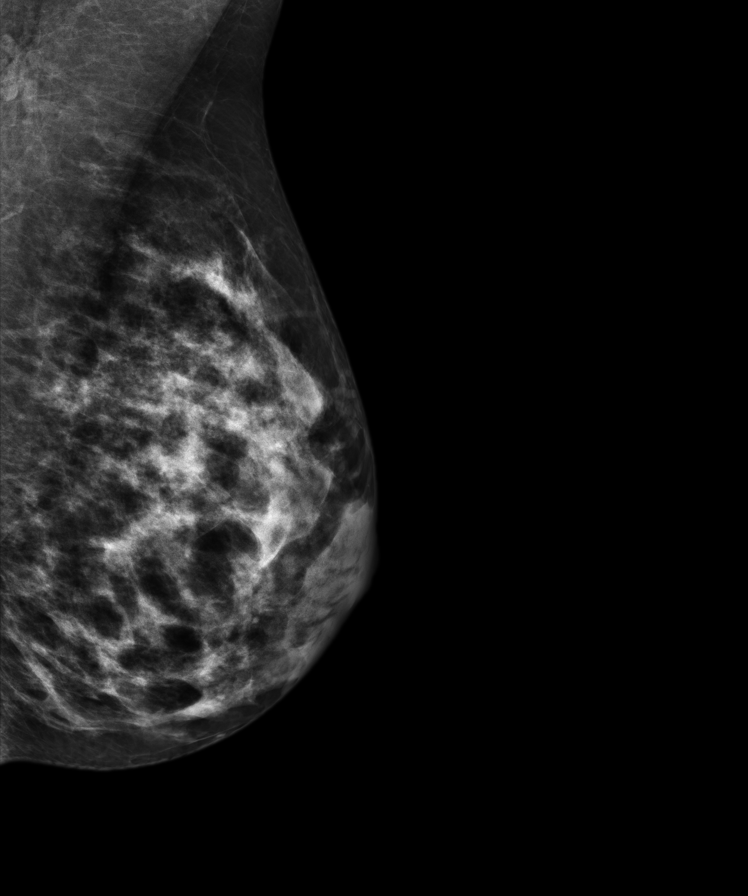
[im 7/8]
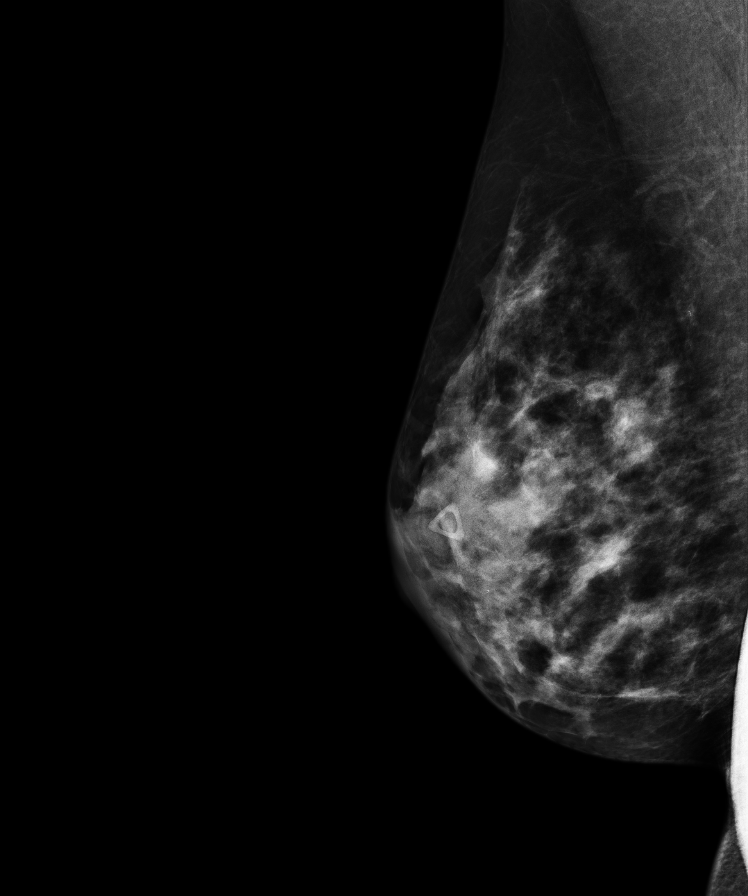
[im 8/8]
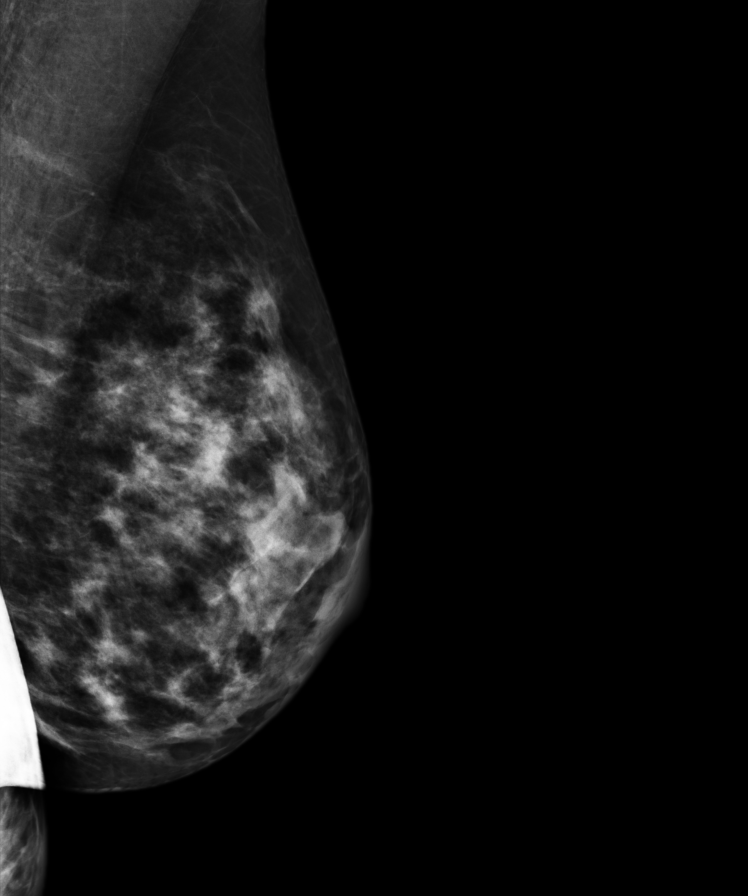

[8 of 8 positions shown; findings below may reference images not displayed]

PROCEDURE:     MAM - MAM DGTL DIAGNOSTIC MAMMO W/CAD  - November 01, 2012 [DATE]

RESULT:     Breast are dense and nodular. Scattered calcifications noted. No
definite mass lesion noted. In the region of palpable abnormality right
breast no focal abnormalities noted by mammography. Ultrasound reveals a
prominent heterogeneous focus. Finding by ultrasound may represent a
hematoma, however malignancy cannot be excluded and surgical consultation is
suggested. Mammogram is stable from prior study of 12/07/2008.
IMPRESSION: BI-RADS: Category 4 - Suspicious Abnormality - Biopsy
Should Be Considered

A NEGATIVE MAMMOGRAM REPORT DOES NOT PRECLUDE BIOPSY OR OTHER EVALUATION OF
A CLINICALLY PALPABLE OR OTHERWISE SUSPICIOUS MASS OR LESION. BREAST CANCER
MAY NOT BE DETECTED IN UP TO 10% OF CASES.

## 2014-08-31 ENCOUNTER — Ambulatory Visit: Payer: Self-pay | Admitting: Oncology

## 2014-09-22 IMAGING — CT CT CHEST-ABD-PELV W/ CM
1 of 3 series · 13 of 32 positions shown, 18 images · non-contrast
Comparison: none

REASON FOR EXAM: breast CA inital staging
COMMENTS:

PROCEDURE:     KCT - KCT CHEST ABDOMEN AND PELVIS W  - November 25, 2012  [DATE]
RESULT:     Comparison: None
TECHNIQUE: Multiple axial images obtained from the thoracic inlet to the
pubic symphysis, with p.o. contrast and with 85 ml of 2sovue-VYG intravenous
contrast.

[Series 2: ch-ab-pel w 3.0 i40f 3 · axial · 0.92mm/px · z∈[-1073,-497]mm · 13 of 217 slices shown, 18 images]
[im 13/217  soft-tissue]
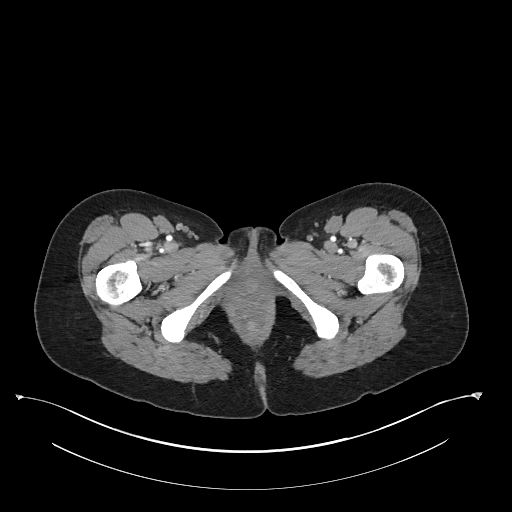
[im 13/217  bone]
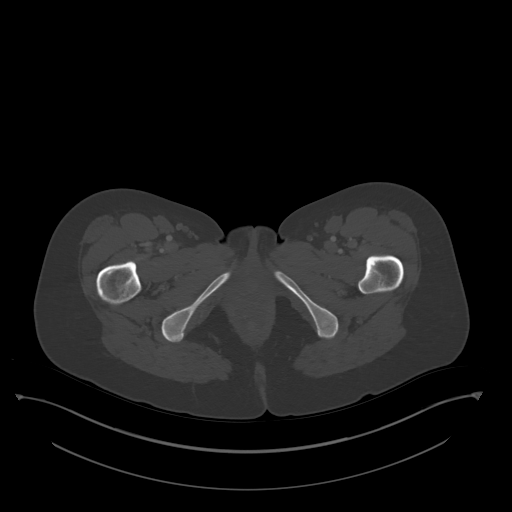
[im 37/217  soft-tissue]
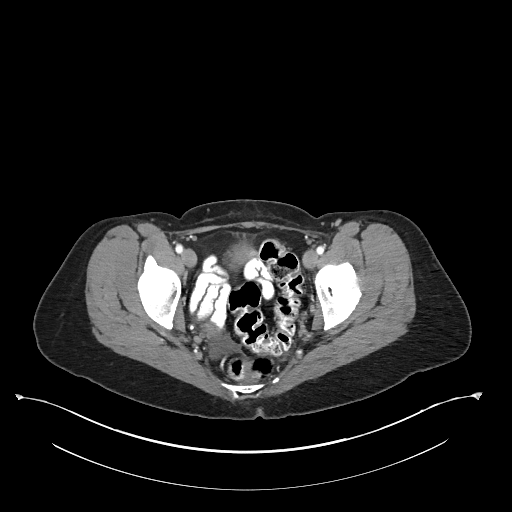
[im 49/217  soft-tissue]
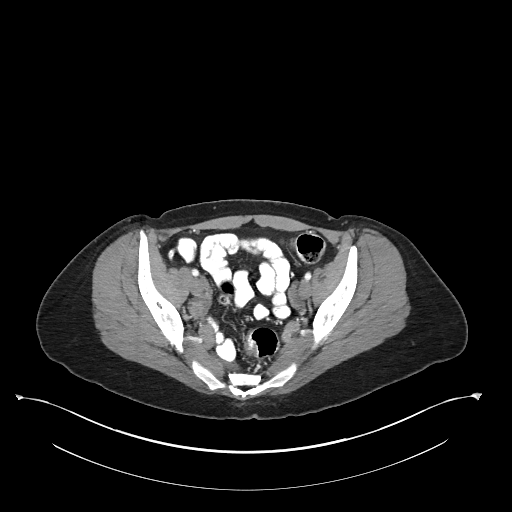
[im 61/217  soft-tissue]
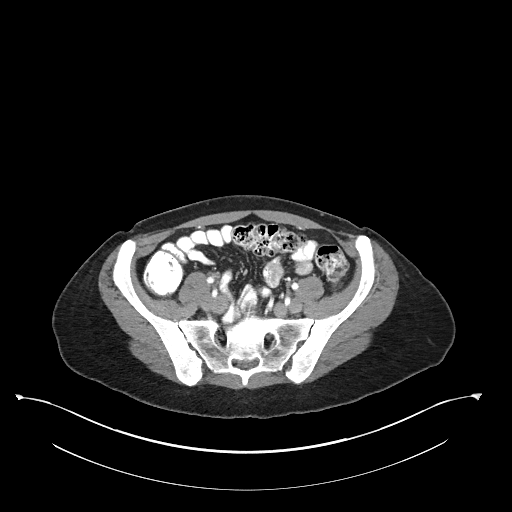
[im 85/217  soft-tissue]
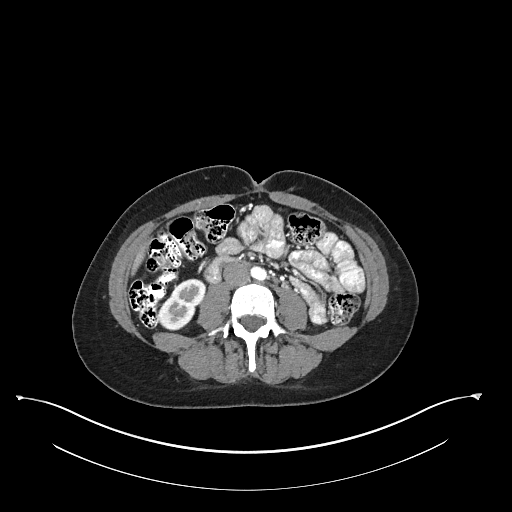
[im 97/217  soft-tissue]
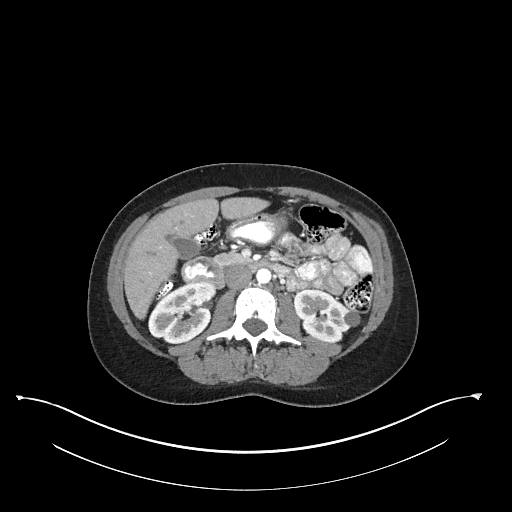
[im 121/217  soft-tissue]
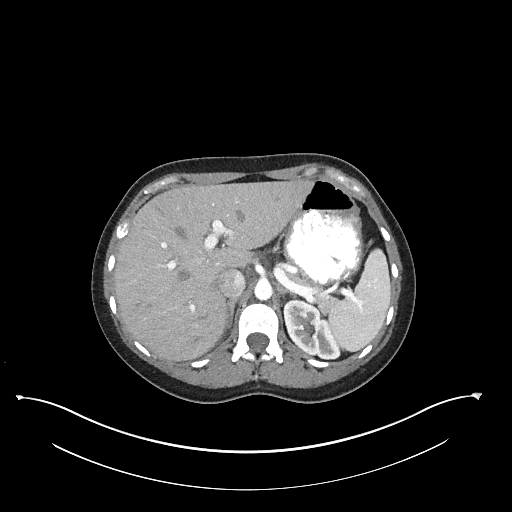
[im 133/217  soft-tissue]
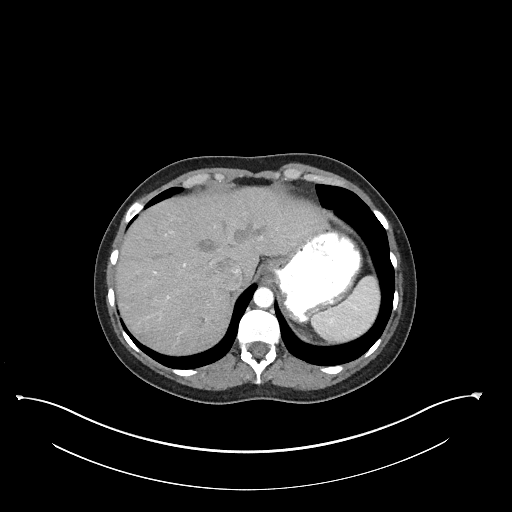
[im 157/217  soft-tissue]
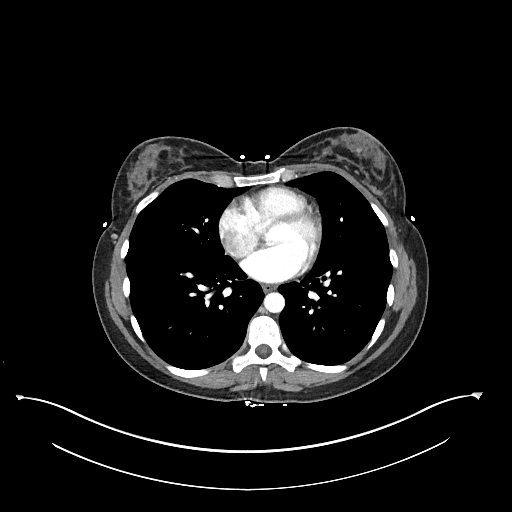
[im 157/217  bone]
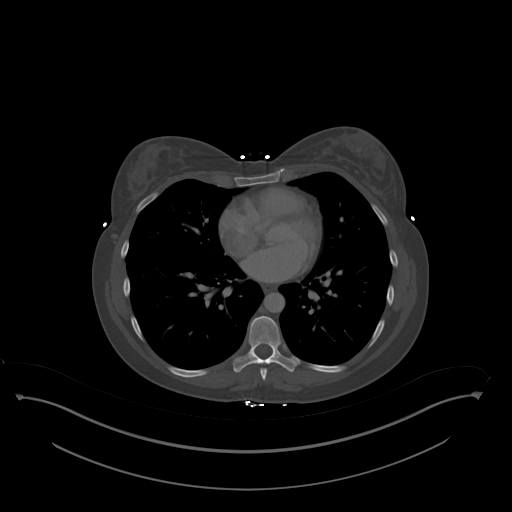
[im 169/217  soft-tissue]
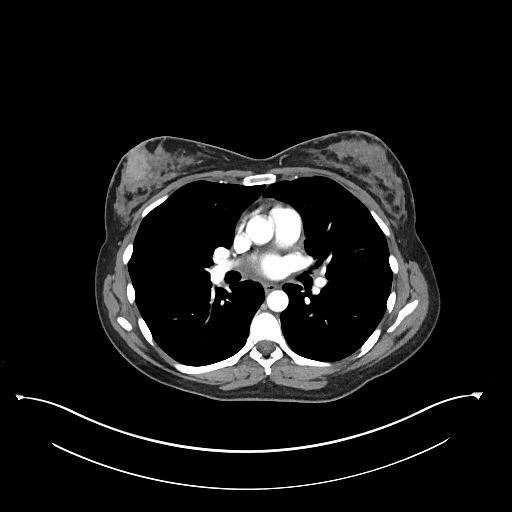
[im 169/217  lung]
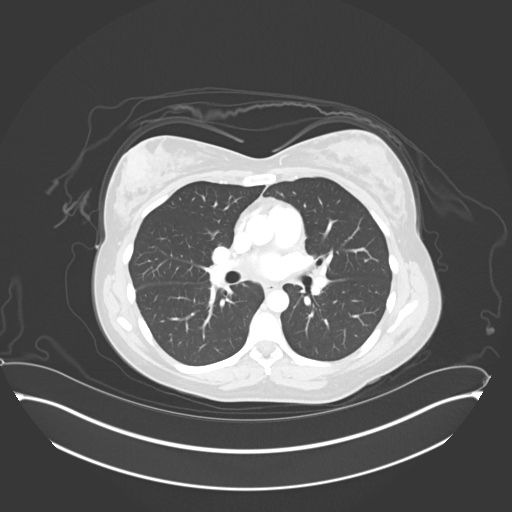
[im 181/217  soft-tissue]
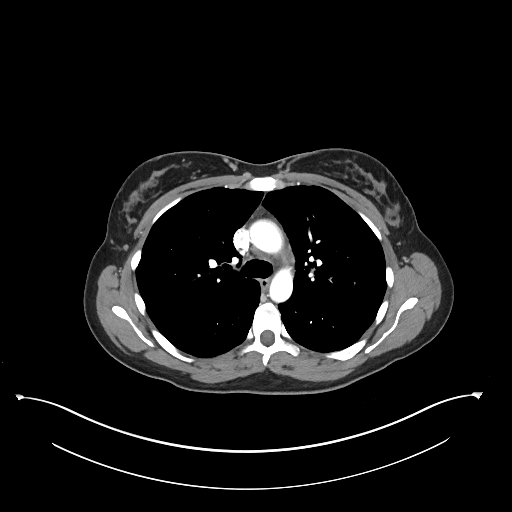
[im 181/217  lung]
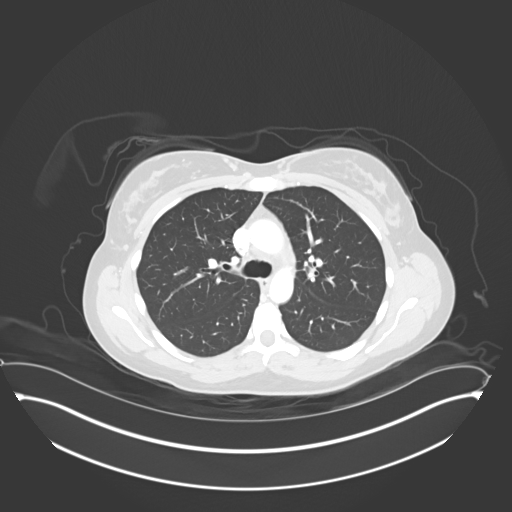
[im 193/217  lung]
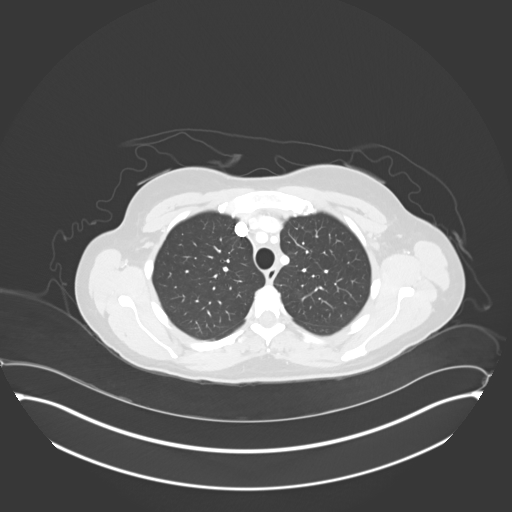
[im 205/217  soft-tissue]
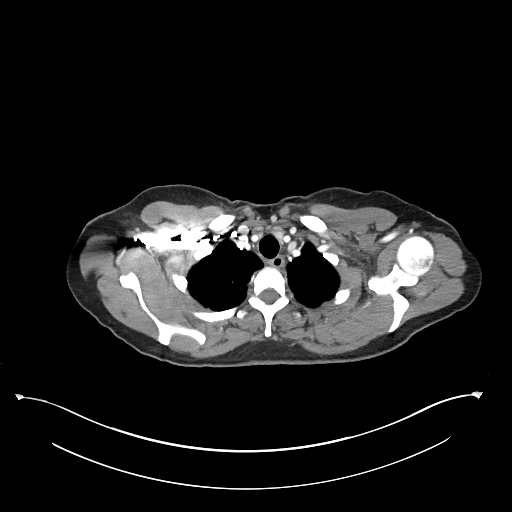
[im 205/217  lung]
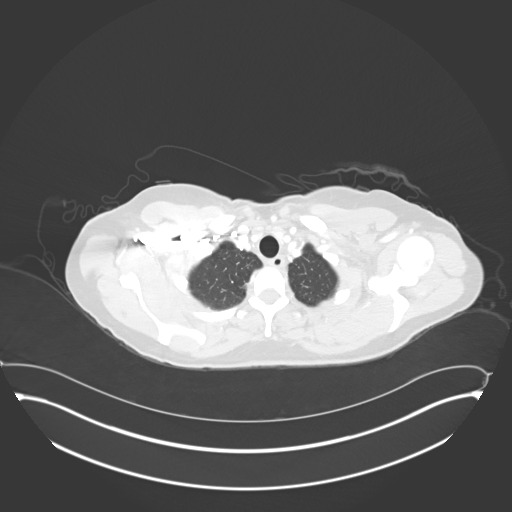

[13 of 32 positions shown; findings below may reference images not displayed]

FINDINGS: There is a tiny low-attenuation focus in the right lobe of the thyroid,
which is nonspecific. No mediastinal, hilar, or axillary lymphadenopathy.
Streak artifact from the contrast bolus in the right subclavian vein limits
evaluation of the subpectoral lymph nodes in this region. There is an area
of ill-defined masslike density in the right breast which likely correlates
with the patient's breast malignancy. There is a 3 mm groundglass nodular
density in the right upper lobe, image 22. This is in a relatively
subpleural location.

The liver, gallbladder, spleen, adrenals, and pancreas are unremarkable.
There is a 1.4 cm low-attenuation exophytic lesion in the left kidney. It is
indeterminate by density criteria. It measures 24 Hounsfield units on the
contrasted study and 20 Hounsfield units on the delayed images. There is a
small low-attenuation lesion in the right kidney which is too small to
characterize.

No retroperitoneal or mesenteric lymphadenopathy. There is a trace amount of
free fluid in the pelvis, which is likely physiologic. The patient is status
post hysterectomy. The appendix is normal. There are several tiny sclerotic
foci in the femoral head and intertrochanteric region bilaterally. There is
a tiny sclerotic focus in the right humeral head. A tiny sclerotic focus is
demonstrated in the left scapula. There is a tiny sclerotic density in the
lateral aspect of the T3 vertebral body.
IMPRESSION: 1. Ill-defined mass in the right breast likely correlates with the patient's
known malignancy.
2. There is a 3 mm groundglass nodular density in the periphery the right
upper lobe which may be related to atelectasis/scarring, but is
indeterminate.
3. Multiple tiny sclerotic foci in the proximal femurs, right humeral head,
left scapula, and T3 vertebral body. These likely represent bone islands.
Tiny foci of metastatic disease would be difficult to exclude. Recommend
attention on the followup study.
4. Small low-attenuation lesion in left kidney may represent a hemorrhagic
or proteinaceous cyst, but is indeterminate. Further evaluation could be
provided with dedicated multiphasic contrast-enhanced renal CT.

## 2014-11-16 ENCOUNTER — Ambulatory Visit: Payer: Self-pay | Admitting: Oncology

## 2014-11-20 LAB — CANCER ANTIGEN 27.29: CA 27.29: 13.5 U/mL (ref 0.0–38.6)

## 2014-12-01 ENCOUNTER — Ambulatory Visit: Payer: Self-pay | Admitting: Oncology

## 2014-12-14 ENCOUNTER — Ambulatory Visit: Payer: Self-pay | Admitting: Surgery

## 2015-02-13 ENCOUNTER — Ambulatory Visit
Admit: 2015-02-13 | Disposition: A | Discharge status: Home / Self Care | Payer: Self-pay | Attending: Oncology | Admitting: Oncology

## 2015-03-02 ENCOUNTER — Ambulatory Visit
Admit: 2015-03-02 | Disposition: A | Discharge status: Home / Self Care | Payer: Self-pay | Attending: Oncology | Admitting: Oncology

## 2015-03-23 NOTE — Consult Note (Signed)
Reason for Visit: This 44 year old Female patient presents to the clinic for initial evaluation of  breast cancer .   Referred by Dr. Grayland Ormond.  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  Pathology Report pathology report reviewed   Imaging Report mammograms ultrasound and CT scan reviewed   Referral Report clinical notes reviewed   Planned Treatment Regimen adjuvant whole breast radiation   HPI   patient is a pleasant 44 year old female who initially presented with enlargement of her right breast. Initial mammogram showed dense breast with nodularity showing no definitive lesion. Ultrasound confirmed a 2.5 cm mass in the right breast at the 9 position suspicious for abnormality. 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 Based on the size of the lesion she underwent neoadjuvant dose dense chemotherapy Cytoxan Adriamycin plus Taxol. She's also received Herceptin and is currently on Herceptin therapy for a year. She went on to have a wide local excision 2 weeks prior showing no residual invasive component left in her right breast. There was ductal carcinoma in situ present although margins were clear but close.margin for DCIS was less than 1 mm.4 sentinel lymph nodes were examined none had metastatic disease. She continues on Herceptin and is now referred to radiation oncology for opinion. She is healing well from her surgery and is basically without complaint. She does have some slight tenderness in her right axilla from the sentinel node biopsy.  Past Hx:    Chemotherapy:    Breast Cancer: Right   HTN:    Portacath Right side:  Jan 2014   Breast Biopsy:    Hysterectomy:    BTL:   Past, Family and Social History:  Past Medical History positive   Cardiovascular hypertension   Past Surgical History hysterectomy and bilateral tubal ligation   Family History positive   Family History Comments maternal grandmother with breast cancer and history of hypertension   Social History noncontributory   Additional Past Medical and Surgical History accompanied by her mother today.   Allergies:   No Known Allergies:   Home Meds:  Home Medications: Medication Instructions Status  Norco 325 mg-5 mg oral tablet  orally 1 to 2 every 4 hours as needed for pain Active  amphetamine-dextroamphetamine 20 mg oral tablet 1 tab(s) orally once a day, As Needed Active  hydrochlorothiazide-losartan 25 mg-100 mg oral tablet 1 tab(s) orally once a day Active  multivitamin 1 tab(s) orally once a day Active  Fish Oil 1000 mg oral capsule 1 cap(s) orally once a day Active  Vitamin D2 50,000 intl units oral capsule 1 cap(s) orally once a week-Saturday or Sunday Active  PNV Select Prenatal Multivitamins with Folic Acid 1 mg oral tablet 1 tab(s) orally once a day Active  Hair, Skin and Nails 1 tab(s) orally once a day Active   Review of Systems:  General negative   Performance Status (ECOG) 0   Skin negative   Breast see HPI   Ophthalmologic negative   ENMT negative   Respiratory and Thorax negative   Cardiovascular negative   Gastrointestinal negative   Genitourinary negative   Musculoskeletal negative   Neurological negative   Psychiatric negative   Hematology/Lymphatics negative   Endocrine negative   Allergic/Immunologic negative  Nursing Notes:  Nursing Vital Signs and Chemo Nursing Nursing Notes: *CC Vital Signs Flowsheet:   31-Jul-14 14:48  Temp Temperature 98.7  Pulse Pulse 77  Respirations Respirations 20  SBP SBP 127  DBP DBP 78  Pain Scale (0-10)  0  Height (cm) centimeters 166.5    Physical Exam:  General/Skin/HEENT:  General normal   Skin normal   Eyes normal   ENMT normal   Head and Neck normal   Additional PE well-developed well-nourished female in NAD. Right breast is a wide local excision which is healing well. Sentinel node biopsy site is also healing well. No dominant mass or nodularity is noted in either breast into position examined. No axillary or supraclavicular adenopathy is appreciated. She is a port present in the left anterior chest wall. Abdomen is benign.   Breasts/Resp/CV/GI/GU:  Respiratory and Thorax normal   Cardiovascular normal   Gastrointestinal normal   Genitourinary normal   MS/Neuro/Psych/Lymph:  Musculoskeletal normal   Neurological normal   Lymphatics normal   Other Results:  Radiology Results: LabUnknown:    02-Dec-13 10:53, Digital Diagnostic Mammogram Bilateral  [id number]   REASON FOR EXAM:    RT BR MASS 9 OCLOCK AND YRLY  COMMENTS:       PROCEDURE: MAM - MAM DGTL DIAGNOSTIC MAMMO W/CAD  - Nov 01 2012 10:53AM     RESULT: Breast are dense and nodular. Scattered calcifications noted. No   definite mass lesion noted. In the region of palpable abnormality right   breast no focal abnormalities noted by mammography. Ultrasound reveals a   prominent heterogeneous focus. Finding by ultrasound may represent a   hematoma, however malignancy cannot be excluded and surgical consultation   is suggested. Mammogram is stable from prior study of 12/07/2008.    IMPRESSION:  BI-RADS: Category 4 - Suspicious Abnormality - Biopsy Should   Be Considered  A NEGATIVE MAMMOGRAM REPORT DOES NOT PRECLUDE BIOPSY OR OTHER EVALUATION   OF A CLINICALLY PALPABLE OR OTHERWISE SUSPICIOUS MASS OR LESION. BREAST   CANCER MAY NOT BE DETECTED IN UP TO 10% OF CASES.     Thank you for the oppurtunity to contribute to the care of your patient.        Verified By: Osa Craver, M.D., MD  PACS Image     02-Dec-13 11:41, US Breast  Right  [email or web address]   REASON FOR EXAM:    RT BR MASS 9 OCLOCK AND YRLY  COMMENTS:       PROCEDURE: [place]  - US BREAST RIGHT  - Nov 01 2012 11:41AM     RESULT: Ultrasound of the right breast reveals an ill-defined region of   heterogeneous hypoechoic tissue at 9:00 in the right breast. The patient   was reportedly injured in this region. This could represent a hematoma.   Possibility of breast cancer cannot be excluded. Surgical evaluation is   suggested.A followup exam in a as needed. Mammogram obtained reveals no   focal mass lesion or clustered calcification.    IMPRESSION:  Ill-defined approximately 2.5 cm parenchymal density in the   outer aspect of the right breast seen on ultrasound. This by history is   at a site of injury and this lesion could represent hematoma. Malignancy     cannot be excluded and surgical consultation suggested.    BI-RADS: Category 4 - Suspicious Abnormality - Biopsy Should Be Considered      A NEGATIVE MAMMOGRAM REPORT DOES NOT PRECLUDE BIOPSY OR OTHER EVALUATION  OF A CLINICALLY PALPABLE OR OTHERWISE SUSPICIOUS MASS OR LESION. BREAST   CANCER MAY NOT BE DETECTED IN UP TO 10% OF CASES.     Thank you for the oppurtunity to contribute to the care of your patient.        Verified By: Osa Craver, M.D., MD  PACS Image     26-Dec-13 09:40, CT Chest, Abd, and Pelvis With Contrast  PACS Image   CT:  CT Chest, Abd, and Pelvis With Contrast   REASON FOR EXAM:    breast CA inital staging  COMMENTS:       PROCEDURE: KCT - KCT CHEST ABDOMEN AND PELVIS W  - Nov 25 2012  9:40AM     RESULT: Comparison: None    Technique: Multiple axial images obtained from the thoracic inlet to the   pubic symphysis, with p.o. contrast and with 85 ml of Isovue-370   intravenous contrast.    Findings:  There is a tiny low-attenuation focus in the right lobe of the thyroid,   which is nonspecific. No mediastinal, hilar, or axillary lymphadenopathy.   Streak  artifact from the contrast bolus in the right subclavian vein     limits evaluation of the subpectoral lymph nodes in this region. There is   an area of ill-defined masslike density in the right breast which likely   correlates with the patient's breast malignancy. There is a 3 mm   groundglass nodular density in the right upper lobe, image 22. This is in   a relatively subpleural location.    The liver, gallbladder, spleen, adrenals, and pancreas are unremarkable.   There is a 1.4 cm low-attenuation exophytic lesion in the left kidney. It   is indeterminate by density criteria. It measures 24 Hounsfield units on   the contrasted study and 20 Hounsfield units on the delayed images. There   is a small low-attenuation lesion in the right kidney which is too small   to characterize.    No retroperitoneal or mesenteric lymphadenopathy. There is a trace amount   of free fluid in the pelvis, which is likely physiologic. The patient is     status post hysterectomy. The appendix is normal. There are several tiny   sclerotic foci in the femoral head and intertrochanteric region   bilaterally. There is a tiny sclerotic focus in the right humeral head. A   tiny sclerotic focus is demonstrated in the left scapula. There is a tiny   sclerotic density in the lateral aspect of the T3 vertebral body.    IMPRESSION:   1. Ill-defined mass in the right breast likely correlates with the   patient's known malignancy.  2. There is a 3 mm groundglass nodular density in the periphery the right   upper lobewhich may be related to atelectasis/scarring, but is   indeterminate.  3. Multiple tiny sclerotic foci in the proximal femurs, right humeral   head, left scapula, and T3 vertebral body. These likely represent bone   islands. Tiny foci of metastatic disease would be difficult to exclude.     Recommend attention on the followup study.  4. Small low-attenuation lesion in left kidney may represent a    hemorrhagic or proteinaceous cyst, but is indeterminate. Further   evaluation could be provided withdedicated multiphasic contrast-enhanced   renal CT.        Verified By: Gregor Hams, M.D., MD   Relevent Results:   Relevant Scans and Labs mammograms ultrasound and CT scan were  all reviewed.   Assessment and Plan: Impression:   44 year old female with initial stage II invasive mammary carcinoma right breast status post neoadjuvant chemotherapy with complete response in the breast with only ductal carcinoma in situ remaining. Tumor is ER PR borderline positive and over expresses HER-2/neu. Patient continues on Herceptin therapy Plan:   at this time I have recommended whole breast radiation to the right breast. I would plan on delivering 5000 cGy over 5 weeks and boost or scar another 1600 cGy based on the close margin of DCIS. Risks and benefits of treatment including skin reaction, fatigue, conclusion of some superficial lung were all explained in detail to the patient. Both her and her mother seem to comprehend my treatment plan well. Patient will also continue with Herceptin under Dr. Gary Fleet care. I have set her up for CT simulation the middle of it next week and will plan on beginning treatments in approximately 2 weeks.  I would like to take this opportunity to thank you for allowing me to continue to participate in this patient's care.  CC Referral:  cc: Dr. Tamala Julian, Dr. Maryland Pink   Electronic Signatures: Baruch Gouty, Roda Shutters (MD)  (Signed 31-Jul-14 16:00)  Authored: HPI, Diagnosis, Past Hx, PFSH, Allergies, Home Meds, ROS, Nursing Notes, Physical Exam, Other Results, Relevent Results, Encounter Assessment and Plan, CC Referring Physician   Last Updated: 31-Jul-14 16:00 by Armstead Peaks (MD)

## 2015-03-23 NOTE — Op Note (Signed)
PATIENT NAME:  CAMERA, KRIENKE MR#:  [id number] DATE OF BIRTH:  1971-04-05  DATE OF PROCEDURE:  12/06/2012  PREOPERATIVE DIAGNOSIS: Breast cancer.   POSTOPERATIVE DIAGNOSIS: Breast cancer.   PROCEDURE: Insertion of central venous catheter with subcutaneous infusion port.   SURGEON: Rochel Brome, MD  ANESTHESIA: Local 1% Xylocaine with monitored anesthesia care.   INDICATIONS: This 44 year old female recently had findings of cancer of the right breast, now needing central venous access for neoadjuvant chemotherapy.   DESCRIPTION OF PROCEDURE: The patient was placed on the operating table in the supine position, was sedated, and monitored by the anesthesia staff. A rolled sheet was placed behind her shoulder blades. The neck was extended. The left subclavian area and the neck were prepared with ChloraPrep and draped in a sterile manner. The jugular vein was identified with ultrasound and appeared to be adequate. The carotid artery was identified as well.   Next, the skin beneath the clavicle was infiltrated with 1% Xylocaine. A transversely oriented 3 cm incision was made, carried down through subcutaneous tissues, and created a pouch just anterior to the deep fascia large enough to admit the PFM port. The wound was inspected. As noted, during the course of the procedure, numerous small bleeding points were cauterized. Next, the jugular vein was again identified with ultrasound and with the patient in the Trendelenburg position, the skin overlying the jugular vein was infiltrated with 1% Xylocaine. A transversely oriented 6 mm incision was made, carried down through subcutaneous tissues. Next, using ultrasound guidance, a needle was inserted into the jugular vein and aspirated blood. A guidewire was inserted. The needle withdrawn. The ultrasound image was saved for the paper chart. The guidewire was inserted and used fluoroscopy; however, it did not appeared to be in the jugular vein after manipulating  with use of fluoroscopy and ultimately removed the guidewire, and again used ultrasound to reinsert a needle into the jugular vein. The guidewire went in more easily and fluoroscopy was used to demonstrate the location of the guidewire in the vena cava. Next, the dilator and introducer sheath were advanced over the guidewire. The dilator and guidewire were removed. There was venous back bleeding. The catheter was inserted and the sheath was peeled away. Next, the catheter tip was positioned in the vena cava as seen with fluoroscopy and a fluoroscopic image was saved for the paper chart. Next, the catheter was cut and attached to the tunneler, and the catheter was tunneled down to the subclavian port. The catheter was cut to fit and attached to the PFM port with the accompanying sleeve to secure it. The port was accessed with [facility] needle, aspirated with trace of blood, and then flushed with 10 mL of saline. The port was placed into the subcutaneous pouch and was sutured to the deep fascia with 4-0 silk. Hemostasis was intact. The subcutaneous tissues were closed with 5-0 Vicryl and both skin incisions were closed with 5-0 Vicryl subcuticular suture and Dermabond. The patient tolerated surgery satisfactorily and was then prepared for transfer to the recovery room.    ____________________________ Lenna Sciara. Rochel Brome, MD jws:es D: 12/06/2012 17:33:59 ET T: 12/07/2012 08:55:18 ET JOB#: [id number]  cc: Loreli Dollar, MD, <Dictator> Loreli Dollar MD ELECTRONICALLY SIGNED 12/10/2012 18:59

## 2015-03-23 NOTE — Op Note (Signed)
PATIENT NAME:  Mikayla Romero, Mikayla Romero MR#:  [id number] DATE OF BIRTH:  1971/03/08  DATE OF PROCEDURE:  06/13/2013  PREOPERATIVE DIAGNOSIS:  Carcinoma of the right breast.   POSTOPERATIVE DIAGNOSIS:  Carcinoma of the right breast.   PROCEDURE:  Right partial mastectomy with axillaris Sentinel lymph node biopsy.   SURGEON:  Rochel Brome, M.D.   ANESTHESIA:  General.   INDICATION:  This 44 year old female presented in December with a 2.5 cm mass at the lateral aspect, right breast. A biopsy demonstrated cancer. She had preoperative neoadjuvant chemotherapy. The mass could no longer be palpated. Did have abnormal ultrasound. It demonstrated irregularity in the lateral aspect of the right breast. Mammogram did not  demonstrate a mass, but did demonstrate microcalcifications in the upper outer quadrant of the right breast. MRI was negative. Surgery was recommended for further evaluation and treatment.  The patient did have preoperative injection of radioactive technetium sulfur colloid. Also, had insertion of _____ wire in the upper outer quadrant. These mammogram images were reviewed, demonstrating the location of the board and the microcalcifications.   The patient was placed on the operating table in the supine position under general anesthesia. The dressing was removed from the right breast, exposing the Covance wire which was cut 2 cm from the skin. The right arm was placed on a lateral arm rest. The breasts and surrounding chest wall and upper arm were prepared with ChloraPrep, draped in a sterile manner.   The axilla was probed with a gamma counter, demonstrating location of radioactivity, the inferior aspect of the right axilla. Next, an oblique incision was made, 4 cm in length and carried down through subcutaneous tissues. One traversing vein was suture-ligated with 4-0 chromic. Dissection was carried down through the superficial fascia adjacent to the ribcage where the gamma counter demonstrated location  of a lymph node. This could be palpated and was grasped with a hemostat and elevated and dissected away from surrounding tissues with the use of electrocautery; however, during the course of the dissection of this lymph, there appeared to be a somewhat larger lymph node deep to it, which was also slightly firm and appeared to have some radioactivity. It was included in the resection and then there was a third lymph node which was palpable which was adjacent to the second which was resection all in one block of tissue. The stitch was placed adjacent to the highest point of radiation, which the ex vivo count was greater than 700 counts per second.  The background count was less than 7 counts per second. The lymph nodes were submitted for frozen section. The one was inspected. It is noted that during the course of the procedure a number of small bleeding parts are cauterized. Hemostasis does appear to be intact. A moist gauze sponge was placed in the wound.   Next, attention was turned to do the right partial mastectomy. An incision was made from approximately 8 o'clock to 11 o'clock position of the right breast, several centimeters lateral to the border of the areola. There was a small scar which appeared to be related to a previous core needle biopsy. The incision was made so that would be included and also removed approximately 1 cm width of skin. Dissection was carried down through subcutaneous tissues and encountered the wire.  There was some firmness in the glandular portion of the tissue, which this firm area was included in the resection and dissected down beyond the bar over the wire to include the microcalcifications  and dissected down as deep as prepectoral fat. The specimen was labeled with the margin map markers to the specimen to label the mediolateral, craniocaudal and deep margins. Also, the 11 o'clock position of the skin ellipse was tagged with a stitch and comprehensive _____ was left intact.  Specimen was sent for specimen mammogram and pathology to check for margins. The wound was inspected. Several small bleeding parts were cauterized. Hemostasis appeared to be intact.   Attention was turned back to the axillary wound, which is inspected. Hemostasis was intact. Subcutaneous tissues were closed with 4-0 chromic. The subcutaneous tissues were infiltrated with 0.5% Sensorcaine with epinephrine.    Attention was turned to the partial mastectomy wound, which was further inspected. Hemostasis was intact. Subcutaneous tissues were closed with interrupted 4-0 chromic, also infiltrated with 0.5% Sensorcaine with epinephrine. The skin was closed with running 5-0 Monocryl subcuticular suture.   Pathologist called back, reported the Sentinel lymph nodes had a few atypical cells, but no cancer was seen and also reported that the specimen contained some fibrosis, although it appeared that the margins were negative.       Both wounds was then treated with Dermabond. The patient appeared to tolerate the procedure satisfactorily and was then prepared for transfer to the recovery room.   ____________________________ Lenna Sciara. Rochel Brome, MD jws:kc D: 06/13/2013 12:46:00 ET T: 06/13/2013 16:08:52 ET JOB#: [id number]  cc: Loreli Dollar, MD, <Dictator> Loreli Dollar MD ELECTRONICALLY SIGNED 06/14/2013 17:59

## 2015-08-14 ENCOUNTER — Encounter: Payer: Self-pay | Admitting: Oncology

## 2015-08-16 ENCOUNTER — Encounter: Payer: Self-pay | Admitting: Oncology

## 2015-08-16 ENCOUNTER — Inpatient Hospital Stay: Payer: 59 | Attending: Oncology | Admitting: Oncology

## 2015-08-16 VITALS — BP 128/83 | HR 85 | Temp 98.2°F | Resp 18 | Wt 132.9 lb

## 2015-08-16 DIAGNOSIS — Z79810 Long term (current) use of selective estrogen receptor modulators (SERMs): Secondary | ICD-10-CM | POA: Diagnosis not present

## 2015-08-16 DIAGNOSIS — E876 Hypokalemia: Secondary | ICD-10-CM | POA: Insufficient documentation

## 2015-08-16 DIAGNOSIS — I10 Essential (primary) hypertension: Secondary | ICD-10-CM | POA: Diagnosis not present

## 2015-08-16 DIAGNOSIS — D0511 Intraductal carcinoma in situ of right breast: Secondary | ICD-10-CM | POA: Insufficient documentation

## 2015-08-16 DIAGNOSIS — Z9011 Acquired absence of right breast and nipple: Secondary | ICD-10-CM | POA: Diagnosis not present

## 2015-08-16 DIAGNOSIS — Z170 Estrogen receptor positive status [ER+]: Secondary | ICD-10-CM | POA: Insufficient documentation

## 2015-08-16 DIAGNOSIS — E785 Hyperlipidemia, unspecified: Secondary | ICD-10-CM | POA: Diagnosis not present

## 2015-08-16 DIAGNOSIS — C50911 Malignant neoplasm of unspecified site of right female breast: Secondary | ICD-10-CM

## 2015-08-26 DIAGNOSIS — C50411 Malignant neoplasm of upper-outer quadrant of right female breast: Secondary | ICD-10-CM | POA: Insufficient documentation

## 2015-08-26 NOTE — Progress Notes (Signed)
[place]  [phone]) [phone] Fax:(336) [phone]  ID: Mikayla Romero OB: [birth year]  MR#: [id number]  AJG#:[id number]  Patient Care Team: [place] Pink, MD as PCP - General (Family Medicine)  CHIEF COMPLAINT:  Chief Complaint  Patient presents with  . Breast Cancer    INTERVAL HISTORY: Patient returns to clinic today for routine 6 month followup. She continues to tolerate tamoxifen well without significant side effects.  She denies any peripheral neuropathy.  She denies any fevers.  She denies any chest pain, cough, or shortness of breath.  She denies any nausea, vomiting, constipation, or diarrhea.  She has no urinary complaints.  Patient offers no specific complaints today.  REVIEW OF SYSTEMS:   Review of Systems  Constitutional: Negative for fever and malaise/fatigue.  Respiratory: Negative.   Cardiovascular: Negative.   Gastrointestinal: Negative.   Neurological: Negative.  Negative for weakness.    As per HPI. Otherwise, a complete review of systems is negatve.  PAST MEDICAL HISTORY: Past Medical History  Diagnosis Date  . Hypertension   . ADD (attention deficit disorder)   . Hyperlipidemia   . Breast cancer     PAST SURGICAL HISTORY: Past Surgical History  Procedure Laterality Date  . Mastectomy partial / lumpectomy    . Abdominal hysterectomy      FAMILY HISTORY: Paternal grandmother with breast cancer, hypertension.     ADVANCED DIRECTIVES:    HEALTH MAINTENANCE: Social History  Substance Use Topics  . Smoking status: Former Research scientist (life sciences)  . Smokeless tobacco: Never Used  . Alcohol Use: No     Colonoscopy:  PAP:  Bone density:  Lipid panel:  Allergies  Allergen Reactions  . No Known Allergies     Current Outpatient Prescriptions  Medication Sig Dispense Refill  . amphetamine-dextroamphetamine (ADDERALL) 20 MG tablet TK 1 T PO BID  0  . Cholecalciferol (VITAMIN D3) 1000 UNITS CAPS Take by mouth.    .  losartan-hydrochlorothiazide (HYZAAR) 100-25 MG per tablet     . PROAIR HFA 108 (90 BASE) MCG/ACT inhaler INHALE 2 PUFFS PO Q 6 H PRF WHEEZING  12  . tamoxifen (NOLVADEX) 20 MG tablet      No current facility-administered medications for this visit.    OBJECTIVE: Filed Vitals:   08/16/15 1640  BP: 128/83  Pulse: 85  Temp: 98.2 F (36.8 C)  Resp: 18     There is no height on file to calculate BMI.    ECOG FS:0 - Asymptomatic  General: Well-developed, well-nourished, no acute distress. Eyes: Pink conjunctiva, anicteric sclera. Breasts: Patient refused breast exam today. Lungs: Clear to auscultation bilaterally. Heart: Regular rate and rhythm. No rubs, murmurs, or gallops. Abdomen: Soft, nontender, nondistended. No organomegaly noted, normoactive bowel sounds. Musculoskeletal: No edema, cyanosis, or clubbing. Neuro: Alert, answering all questions appropriately. Cranial nerves grossly intact. Skin: No rashes or petechiae noted. Psych: Normal affect.  LAB RESULTS:  Lab Results  Component Value Date   NA 136 12/09/2013   K 3.1* 12/09/2013   CL 99 12/09/2013   CO2 28 12/09/2013   GLUCOSE 107* 12/09/2013   BUN 12 12/09/2013   CREATININE 0.69 12/09/2013   CALCIUM 9.2 12/09/2013   PROT 6.9 12/09/2013   ALBUMIN 3.7 12/09/2013   AST 18 12/09/2013   ALT 25 12/09/2013   ALKPHOS 66 12/09/2013   BILITOT 0.5 12/09/2013   GFRNONAA >60 12/09/2013   GFRAA >60 12/09/2013    Lab Results  Component Value Date   WBC 7.2 12/09/2013  NEUTROABS 4.4 12/09/2013   HGB 13.9 12/09/2013   HCT 40.4 12/09/2013   MCV 93 12/09/2013   PLT 232 12/09/2013     STUDIES: No results found.  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  BRCA negative.  PLAN:    1.  Breast cancer:  No evidence of disease. Patient's  most recent CA 2729 was reported as 12.1.  Continue tamoxifen for total 5 years completing in November 2019.  Patient's most  recent mammogram on December 14, 2014 was reported as BI-RADS 2, repeat in January 2017.  Return to clinic in 6 months for routine evaluation. 2. Hypokalemia: Patient previously was given dietary recommendations.  Patient expressed understanding and was in agreement with this plan. She also understands that She can call clinic at any time with any questions, concerns, or complaints.    Timothy J Finnegan, MD   08/26/2015 8:29 AM      

## 2015-12-12 ENCOUNTER — Other Ambulatory Visit: Payer: Self-pay | Admitting: Surgery

## 2015-12-12 DIAGNOSIS — Z853 Personal history of malignant neoplasm of breast: Secondary | ICD-10-CM

## 2015-12-27 ENCOUNTER — Ambulatory Visit
Admission: RE | Admit: 2015-12-27 | Discharge: 2015-12-27 | Disposition: A | Discharge status: Home / Self Care | Payer: 59 | Source: Ambulatory Visit | Attending: Surgery | Admitting: Surgery

## 2015-12-27 ENCOUNTER — Other Ambulatory Visit: Payer: Self-pay | Admitting: Surgery

## 2015-12-27 DIAGNOSIS — Z853 Personal history of malignant neoplasm of breast: Secondary | ICD-10-CM | POA: Insufficient documentation

## 2016-02-14 ENCOUNTER — Encounter: Payer: Self-pay | Admitting: Hematology and Oncology

## 2016-02-18 ENCOUNTER — Inpatient Hospital Stay: Payer: 59 | Attending: Oncology | Admitting: Oncology

## 2016-02-18 ENCOUNTER — Other Ambulatory Visit: Payer: 59

## 2016-02-18 VITALS — BP 120/82 | HR 77 | Temp 99.6°F | Wt 138.4 lb

## 2016-02-18 DIAGNOSIS — Z87891 Personal history of nicotine dependence: Secondary | ICD-10-CM | POA: Insufficient documentation

## 2016-02-18 DIAGNOSIS — Z170 Estrogen receptor positive status [ER+]: Secondary | ICD-10-CM | POA: Diagnosis not present

## 2016-02-18 DIAGNOSIS — E785 Hyperlipidemia, unspecified: Secondary | ICD-10-CM | POA: Diagnosis not present

## 2016-02-18 DIAGNOSIS — Z79810 Long term (current) use of selective estrogen receptor modulators (SERMs): Secondary | ICD-10-CM | POA: Diagnosis not present

## 2016-02-18 DIAGNOSIS — Z9221 Personal history of antineoplastic chemotherapy: Secondary | ICD-10-CM | POA: Diagnosis not present

## 2016-02-18 DIAGNOSIS — Z923 Personal history of irradiation: Secondary | ICD-10-CM

## 2016-02-18 DIAGNOSIS — Z9011 Acquired absence of right breast and nipple: Secondary | ICD-10-CM

## 2016-02-18 DIAGNOSIS — C50911 Malignant neoplasm of unspecified site of right female breast: Secondary | ICD-10-CM

## 2016-02-18 DIAGNOSIS — I10 Essential (primary) hypertension: Secondary | ICD-10-CM | POA: Diagnosis not present

## 2016-02-18 NOTE — Progress Notes (Signed)
[place]  Telephone:(336) [phone] [phone]) [phone]  ID: Mikayla Romero OB: [birth year]  MR#: [id number]  JHE#:[id number]  Patient Care Team: Maryland Pink, MD as PCP - General (Family Medicine)  CHIEF COMPLAINT:  Chief Complaint  Patient presents with  . Breast Cancer    INTERVAL HISTORY: Patient returns to clinic today for routine 6 month followup. She continues to tolerate tamoxifen well without significant side effects.  She denies any peripheral neuropathy.  She denies any fevers.  She denies any chest pain, cough, or shortness of breath.  She denies any nausea, vomiting, constipation, or diarrhea.  She has no urinary complaints.  Patient offers no specific complaints today.  REVIEW OF SYSTEMS:   Review of Systems  Constitutional: Negative for fever and malaise/fatigue.  Respiratory: Negative.   Cardiovascular: Negative.   Gastrointestinal: Negative.   Neurological: Negative.  Negative for weakness.    As per HPI. Otherwise, a complete review of systems is negatve.  PAST MEDICAL HISTORY: Past Medical History  Diagnosis Date  . Hypertension   . ADD (attention deficit disorder)   . Hyperlipidemia   . Breast cancer [facility]) 2013    right breast with lumpectomy and chemo and rad    PAST SURGICAL HISTORY: Past Surgical History  Procedure Laterality Date  . Mastectomy partial / lumpectomy    . Abdominal hysterectomy    . Breast biopsy Right 2013    positive    FAMILY HISTORY: Paternal grandmother with breast cancer, hypertension.     ADVANCED DIRECTIVES:    HEALTH MAINTENANCE: Social History  Substance Use Topics  . Smoking status: Former Research scientist (life sciences)  . Smokeless tobacco: Never Used  . Alcohol Use: No     Allergies  Allergen Reactions  . No Known Allergies     Current Outpatient Prescriptions  Medication Sig Dispense Refill  . amphetamine-dextroamphetamine (ADDERALL) 20 MG tablet TK 1 T PO BID  0  . Cholecalciferol (VITAMIN D3) 1000  UNITS CAPS Take by mouth.    . losartan-hydrochlorothiazide (HYZAAR) 100-25 MG per tablet     . tamoxifen (NOLVADEX) 20 MG tablet     . PROAIR HFA 108 (90 BASE) MCG/ACT inhaler Reported on 02/18/2016  12   No current facility-administered medications for this visit.    OBJECTIVE: Filed Vitals:   02/18/16 1554  BP: 120/82  Pulse: 77  Temp: 99.6 F (37.6 C)     There is no height on file to calculate BMI.    ECOG FS:0 - Asymptomatic  General: Well-developed, well-nourished, no acute distress. Eyes: Pink conjunctiva, anicteric sclera. Breasts: Patient refused breast exam today. Lungs: Clear to auscultation bilaterally. Heart: Regular rate and rhythm. No rubs, murmurs, or gallops. Abdomen: Soft, nontender, nondistended. No organomegaly noted, normoactive bowel sounds. Musculoskeletal: No edema, cyanosis, or clubbing. Neuro: Alert, answering all questions appropriately. Cranial nerves grossly intact. Skin: No rashes or petechiae noted. Psych: Normal affect.  LAB RESULTS:  Lab Results  Component Value Date   NA 136 12/09/2013   K 3.1* 12/09/2013   CL 99 12/09/2013   CO2 28 12/09/2013   GLUCOSE 107* 12/09/2013   BUN 12 12/09/2013   CREATININE 0.69 12/09/2013   CALCIUM 9.2 12/09/2013   PROT 6.9 12/09/2013   ALBUMIN 3.7 12/09/2013   AST 18 12/09/2013   ALT 25 12/09/2013   ALKPHOS 66 12/09/2013   BILITOT 0.5 12/09/2013   GFRNONAA >60 12/09/2013   GFRAA >60 12/09/2013    Lab Results  Component Value Date   WBC 7.2  12/09/2013   NEUTROABS 4.4 12/09/2013   HGB 13.9 12/09/2013   HCT 40.4 12/09/2013   MCV 93 12/09/2013   PLT 232 12/09/2013   Lab Results  Component Value Date   LABCA2 13.5 11/17/2014     STUDIES: No results found.  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  BRCA negative.  PLAN:    1.  Breast cancer:  No evidence of disease. Patient's  most recent CA 27-29 was reported as above.  Continue  tamoxifen for a total 5 years completing in November 2019.  Patient's most recent mammogram on December 27, 2015 was reported as BI-RADS 2, repeat in January 2018.  Return to clinic in 6 months for routine evaluation.   Patient expressed understanding and was in agreement with this plan. She also understands that She can call clinic at any time with any questions, concerns, or complaints.    Mayra Reel, NP   02/18/2016 4:09 PM   Patient was seen and evaluated independently and I agree with the assessment and plan as dictated above.  Lloyd Huger, MD 02/22/2016 5:33 AM

## 2016-05-15 ENCOUNTER — Other Ambulatory Visit: Payer: Self-pay | Admitting: *Deleted

## 2016-05-15 MED ORDER — TAMOXIFEN CITRATE 20 MG PO TABS: 20.0000 mg | ORAL_TABLET | Freq: Every day | ORAL | Status: DC

## 2016-08-07 ENCOUNTER — Other Ambulatory Visit: Payer: Self-pay | Admitting: Oncology

## 2016-08-18 ENCOUNTER — Encounter: Payer: Self-pay | Admitting: Oncology

## 2016-08-18 NOTE — Progress Notes (Signed)
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  BRCA negative  INTERVAL HISTORY: Patient returns to clinic today for routine 6 month followup. She continues to tolerate tamoxifen well without significant side effects.  She has no neurologic complaints. She denies any peripheral neuropathy.  She denies any fevers.  She denies any chest pain, cough, or shortness of breath.  She denies any nausea, vomiting, constipation, or diarrhea.  She has no urinary complaints.  Patient offers no specific complaints today.  REVIEW OF SYSTEMS:   Review of Systems  Constitutional: Negative for fever and malaise/fatigue.  Respiratory: Negative.  Negative for cough and shortness of breath.   Cardiovascular: Negative.  Negative for chest pain and leg swelling.  Gastrointestinal: Negative.  Negative for abdominal pain.  Neurological: Negative.  Negative for weakness.  Psychiatric/Behavioral: Negative.  The patient is not nervous/anxious.     As per HPI. Otherwise, a complete review of systems is negative.  PAST MEDICAL HISTORY: Past Medical History:  Diagnosis Date  . ADD (attention deficit disorder)   . Breast cancer [facility]) 2013   right breast with lumpectomy and chemo and rad  . Hyperlipidemia   . Hypertension     PAST SURGICAL HISTORY: Past Surgical History:  Procedure Laterality Date  . ABDOMINAL HYSTERECTOMY    . BREAST BIOPSY Right 2013   positive  . MASTECTOMY PARTIAL / LUMPECTOMY      FAMILY HISTORY: Paternal grandmother with breast cancer, hypertension.     ADVANCED DIRECTIVES:    HEALTH MAINTENANCE: Social History  Substance Use Topics  . Smoking status: Former Research scientist (life sciences)  .  Smokeless tobacco: Never Used  . Alcohol use No     Allergies  Allergen Reactions  . No Known Allergies     Current Outpatient Prescriptions  Medication Sig Dispense Refill  . amphetamine-dextroamphetamine (ADDERALL) 20 MG tablet TK 1 T PO BID  0  . Cholecalciferol (VITAMIN D3) 1000 UNITS CAPS Take by mouth.    . losartan-hydrochlorothiazide (HYZAAR) 100-25 MG per tablet     . PROAIR HFA 108 (90 BASE) MCG/ACT inhaler Reported on 02/18/2016  12  . tamoxifen (NOLVADEX) 20 MG tablet Take 1 tablet by mouth  daily 90 tablet 3   No current facility-administered medications for this visit.     OBJECTIVE: Vitals:   08/20/16 1544  BP: 135/87  Pulse: 89  Resp: 18  Temp: (!) 96.9 F (36.1 C)     There is no height or weight on file to calculate BMI.    ECOG FS:0 - Asymptomatic  General: Well-developed, well-nourished, no acute distress. Eyes: Pink conjunctiva, anicteric sclera. Breasts: Patient reports a normal breast exam by another physician in the past several weeks. Lungs: Clear to auscultation bilaterally. Heart: Regular rate and rhythm. No rubs, murmurs, or gallops. Abdomen: Soft, nontender, nondistended. No organomegaly noted, normoactive bowel sounds. Musculoskeletal: No edema, cyanosis, or clubbing. Neuro: Alert, answering all questions appropriately. Cranial nerves grossly intact. Skin: No rashes or petechiae noted. Psych: Normal affect.  LAB RESULTS:  Lab Results  Component Value Date   NA 136 12/09/2013   K 3.1 (L) 12/09/2013   CL 99 12/09/2013   CO2 28 12/09/2013   GLUCOSE 107 (H) 12/09/2013   BUN  12 12/09/2013   CREATININE 0.69 12/09/2013   CALCIUM 9.2 12/09/2013   PROT 6.9 12/09/2013   ALBUMIN 3.7 12/09/2013   AST 18 12/09/2013   ALT 25 12/09/2013   ALKPHOS 66 12/09/2013   BILITOT 0.5 12/09/2013   GFRNONAA >60 12/09/2013   GFRAA >60 12/09/2013    Lab Results  Component Value Date   WBC 7.2 12/09/2013   NEUTROABS 4.4 12/09/2013   HGB 13.9  12/09/2013   HCT 40.4 12/09/2013   MCV 93 12/09/2013   PLT 232 12/09/2013   Lab Results  Component Value Date   LABCA2 13.5 11/17/2014     STUDIES: No results found.  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  BRCA negative.  PLAN:    1.  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  BRCA negative:  No evidence of disease. Patient's CA-27-29 continues to be within normal limits. Continue tamoxifen for a total 5 years completing in November 2019. Although patient has expressed interest in continuing treatment for a total of 10 years which is reasonable given her premenopausal status. Upon nearing completion of her 5 years of tamoxifen will order [facility] and [facility] to determine whether patient is pre- or postmenopausal since she has had a hysterectomy. If she is postmenopausal, will switch her to Aromasin for 5 more years.  Patient's most recent mammogram on December 27, 2015 was reported as BI-RADS 2, repeat in January 2018.  Return to clinic in 6 months for routine evaluation.  Patient expressed understanding and was in agreement with this plan. She also understands that She can call clinic at any time with any questions, concerns, or complaints.    Lloyd Huger, MD   08/20/2016 4:05 PM

## 2016-08-20 ENCOUNTER — Inpatient Hospital Stay: Payer: 59 | Attending: Oncology | Admitting: Oncology

## 2016-08-20 ENCOUNTER — Encounter (INDEPENDENT_AMBULATORY_CARE_PROVIDER_SITE_OTHER): Payer: Self-pay

## 2016-08-20 VITALS — BP 135/87 | HR 89 | Temp 96.9°F | Resp 18 | Wt 146.5 lb

## 2016-08-20 DIAGNOSIS — F988 Other specified behavioral and emotional disorders with onset usually occurring in childhood and adolescence: Secondary | ICD-10-CM | POA: Diagnosis not present

## 2016-08-20 DIAGNOSIS — Z87891 Personal history of nicotine dependence: Secondary | ICD-10-CM | POA: Insufficient documentation

## 2016-08-20 DIAGNOSIS — Z923 Personal history of irradiation: Secondary | ICD-10-CM | POA: Insufficient documentation

## 2016-08-20 DIAGNOSIS — Z9011 Acquired absence of right breast and nipple: Secondary | ICD-10-CM

## 2016-08-20 DIAGNOSIS — Z170 Estrogen receptor positive status [ER+]: Secondary | ICD-10-CM

## 2016-08-20 DIAGNOSIS — E785 Hyperlipidemia, unspecified: Secondary | ICD-10-CM | POA: Diagnosis not present

## 2016-08-20 DIAGNOSIS — Z79810 Long term (current) use of selective estrogen receptor modulators (SERMs): Secondary | ICD-10-CM | POA: Diagnosis not present

## 2016-08-20 DIAGNOSIS — Z9221 Personal history of antineoplastic chemotherapy: Secondary | ICD-10-CM | POA: Diagnosis not present

## 2016-08-20 DIAGNOSIS — I10 Essential (primary) hypertension: Secondary | ICD-10-CM | POA: Insufficient documentation

## 2016-08-20 DIAGNOSIS — D0511 Intraductal carcinoma in situ of right breast: Secondary | ICD-10-CM

## 2016-08-20 DIAGNOSIS — C50411 Malignant neoplasm of upper-outer quadrant of right female breast: Secondary | ICD-10-CM

## 2016-08-20 NOTE — Progress Notes (Signed)
States is feeling well. Offers no complaints. Saw Dr. Tamala Julian about 1 month ago and had CBE performed at that visit.

## 2016-11-10 ENCOUNTER — Other Ambulatory Visit: Payer: Self-pay | Admitting: Surgery

## 2016-11-10 DIAGNOSIS — Z853 Personal history of malignant neoplasm of breast: Secondary | ICD-10-CM

## 2016-12-30 ENCOUNTER — Ambulatory Visit
Admission: RE | Admit: 2016-12-30 | Discharge: 2016-12-30 | Disposition: A | Discharge status: Home / Self Care | Payer: 59 | Source: Ambulatory Visit | Attending: Oncology | Admitting: Oncology

## 2016-12-30 ENCOUNTER — Ambulatory Visit
Admission: RE | Admit: 2016-12-30 | Discharge: 2016-12-30 | Disposition: A | Discharge status: Home / Self Care | Payer: 59 | Source: Ambulatory Visit | Attending: Surgery | Admitting: Surgery

## 2016-12-30 DIAGNOSIS — Z853 Personal history of malignant neoplasm of breast: Secondary | ICD-10-CM

## 2016-12-30 DIAGNOSIS — R928 Other abnormal and inconclusive findings on diagnostic imaging of breast: Secondary | ICD-10-CM | POA: Diagnosis not present

## 2016-12-30 DIAGNOSIS — C50411 Malignant neoplasm of upper-outer quadrant of right female breast: Secondary | ICD-10-CM

## 2016-12-30 DIAGNOSIS — Z9889 Other specified postprocedural states: Secondary | ICD-10-CM | POA: Diagnosis not present

## 2017-01-14 DIAGNOSIS — Z853 Personal history of malignant neoplasm of breast: Secondary | ICD-10-CM | POA: Diagnosis not present

## 2017-02-15 NOTE — Progress Notes (Signed)
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  BRCA negative  INTERVAL HISTORY: Patient returns to clinic today for routine 6 month followup. She continues to tolerate tamoxifen well without significant side effects. She has no neurologic complaints. She denies any peripheral neuropathy.  She denies any fevers.  She denies any chest pain, cough, or shortness of breath.  She denies any nausea, vomiting, constipation, or diarrhea.  She has no urinary complaints.  Patient offers no specific complaints today.  REVIEW OF SYSTEMS:   Review of Systems  Constitutional: Negative for fever and malaise/fatigue.  Respiratory: Negative.  Negative for cough and shortness of breath.   Cardiovascular: Negative.  Negative for chest pain and leg swelling.  Gastrointestinal: Negative.  Negative for abdominal pain.  Genitourinary: Negative.   Musculoskeletal: Negative.   Skin: Negative.   Neurological: Negative.  Negative for sensory change and weakness.  Psychiatric/Behavioral: Negative.  The patient is not nervous/anxious.     As per HPI. Otherwise, a complete review of systems is negative.  PAST MEDICAL HISTORY: Past Medical History:  Diagnosis Date  . ADD (attention deficit disorder)   . Breast cancer [facility]) 2013   right breast with lumpectomy and chemo and rad  . Hyperlipidemia   . Hypertension     PAST SURGICAL HISTORY: Past Surgical History:  Procedure Laterality Date  . ABDOMINAL HYSTERECTOMY    . BREAST BIOPSY Right 2013   positive  . MASTECTOMY PARTIAL / LUMPECTOMY      FAMILY HISTORY: Paternal grandmother with breast cancer, hypertension.     ADVANCED DIRECTIVES:    HEALTH  MAINTENANCE: Social History  Substance Use Topics  . Smoking status: Former Research scientist (life sciences)  . Smokeless tobacco: Never Used  . Alcohol use No     Allergies  Allergen Reactions  . No Known Allergies     Current Outpatient Prescriptions  Medication Sig Dispense Refill  . amphetamine-dextroamphetamine (ADDERALL) 20 MG tablet TK 1 T PO BID  0  . BIOTIN 5000 PO Take 1 Dose by mouth.    . Cholecalciferol (VITAMIN D3) 1000 UNITS CAPS Take by mouth.    . losartan-hydrochlorothiazide (HYZAAR) 100-25 MG per tablet     . tamoxifen (NOLVADEX) 20 MG tablet Take 1 tablet by mouth  daily 90 tablet 3   No current facility-administered medications for this visit.     OBJECTIVE: Vitals:   02/17/17 1542  BP: (!) 128/92  Pulse: 73  Resp: 18  Temp: 98.1 F (36.7 C)     Body mass index is 24.43 kg/m.    ECOG FS:0 - Asymptomatic  General: Well-developed, well-nourished, no acute distress. Eyes: Pink conjunctiva, anicteric sclera. Breasts: Patient reports a normal breast exam by another physician in the past several weeks. Lungs: Clear to auscultation bilaterally. Heart: Regular rate and rhythm. No rubs, murmurs, or gallops. Abdomen: Soft, nontender, nondistended. No organomegaly noted, normoactive bowel sounds. Musculoskeletal: No edema, cyanosis, or clubbing. Neuro: Alert, answering all questions appropriately. Cranial nerves grossly intact. Skin: No rashes or petechiae noted. Psych: Normal affect.  LAB RESULTS:  Lab Results  Component Value Date   NA 136 12/09/2013   K 3.1 (L) 12/09/2013   CL 99 12/09/2013   CO2 28 12/09/2013  GLUCOSE 107 (H) 12/09/2013   BUN 12 12/09/2013   CREATININE 0.69 12/09/2013   CALCIUM 9.2 12/09/2013   PROT 6.9 12/09/2013   ALBUMIN 3.7 12/09/2013   AST 18 12/09/2013   ALT 25 12/09/2013   ALKPHOS 66 12/09/2013   BILITOT 0.5 12/09/2013   GFRNONAA >60 12/09/2013   GFRAA >60 12/09/2013    Lab Results  Component Value Date   WBC 7.2 12/09/2013    NEUTROABS 4.4 12/09/2013   HGB 13.9 12/09/2013   HCT 40.4 12/09/2013   MCV 93 12/09/2013   PLT 232 12/09/2013   Lab Results  Component Value Date   LABCA2 13.5 11/17/2014     STUDIES: No results found.  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  BRCA negative.  PLAN:    1.  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 BRCA negative:  No evidence of disease. Patient's CA-27-29 was checked at [place] and continues to be within normal limits at 21.6. Continue tamoxifen for a total 5 years completing in November 2019. Although patient has expressed interest in continuing treatment for a total of 7-10 years which is reasonable given her premenopausal status. Upon nearing completion of her 5 years of tamoxifen will order [facility] and LH to determine whether patient is pre- or postmenopausal since she has had a hysterectomy. If she is postmenopausal, will switch her to Aromasin.  Patient's most recent mammogram on December 30, 2016 was reported as BI-RADS 2, repeat in January 2019.  Return to clinic in 6 months for routine evaluation.  Patient expressed understanding and was in agreement with this plan. She also understands that She can call clinic at any time with any questions, concerns, or complaints.    Lloyd Huger, MD   02/22/2017 10:23 AM

## 2017-02-16 ENCOUNTER — Encounter: Payer: Self-pay | Admitting: Oncology

## 2017-02-16 DIAGNOSIS — C50411 Malignant neoplasm of upper-outer quadrant of right female breast: Secondary | ICD-10-CM | POA: Diagnosis not present

## 2017-02-16 DIAGNOSIS — I10 Essential (primary) hypertension: Secondary | ICD-10-CM | POA: Diagnosis not present

## 2017-02-16 DIAGNOSIS — E559 Vitamin D deficiency, unspecified: Secondary | ICD-10-CM | POA: Diagnosis not present

## 2017-02-17 ENCOUNTER — Inpatient Hospital Stay: Payer: 59 | Attending: Oncology | Admitting: Oncology

## 2017-02-17 ENCOUNTER — Encounter: Payer: Self-pay | Admitting: Oncology

## 2017-02-17 VITALS — BP 128/92 | HR 73 | Temp 98.1°F | Resp 18 | Ht 64.6 in | Wt 145.0 lb

## 2017-02-17 DIAGNOSIS — Z923 Personal history of irradiation: Secondary | ICD-10-CM | POA: Insufficient documentation

## 2017-02-17 DIAGNOSIS — Z170 Estrogen receptor positive status [ER+]: Secondary | ICD-10-CM | POA: Diagnosis not present

## 2017-02-17 DIAGNOSIS — D0511 Intraductal carcinoma in situ of right breast: Secondary | ICD-10-CM

## 2017-02-17 DIAGNOSIS — Z9221 Personal history of antineoplastic chemotherapy: Secondary | ICD-10-CM | POA: Diagnosis not present

## 2017-02-17 DIAGNOSIS — Z87891 Personal history of nicotine dependence: Secondary | ICD-10-CM | POA: Diagnosis not present

## 2017-02-17 DIAGNOSIS — I10 Essential (primary) hypertension: Secondary | ICD-10-CM | POA: Diagnosis not present

## 2017-02-17 DIAGNOSIS — E785 Hyperlipidemia, unspecified: Secondary | ICD-10-CM | POA: Insufficient documentation

## 2017-02-17 DIAGNOSIS — C50411 Malignant neoplasm of upper-outer quadrant of right female breast: Secondary | ICD-10-CM

## 2017-02-17 DIAGNOSIS — Z9011 Acquired absence of right breast and nipple: Secondary | ICD-10-CM | POA: Diagnosis not present

## 2017-02-17 DIAGNOSIS — Z79810 Long term (current) use of selective estrogen receptor modulators (SERMs): Secondary | ICD-10-CM | POA: Diagnosis not present

## 2017-02-17 NOTE — Progress Notes (Signed)
Pt with no c/o.

## 2017-03-09 DIAGNOSIS — E559 Vitamin D deficiency, unspecified: Secondary | ICD-10-CM | POA: Diagnosis not present

## 2017-03-09 DIAGNOSIS — I10 Essential (primary) hypertension: Secondary | ICD-10-CM | POA: Diagnosis not present

## 2017-07-15 DIAGNOSIS — Z853 Personal history of malignant neoplasm of breast: Secondary | ICD-10-CM | POA: Diagnosis not present

## 2017-08-26 NOTE — Progress Notes (Signed)
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  BRCA negative  INTERVAL HISTORY: Patient returns to clinic today for routine 6 month followup. She continues to tolerate tamoxifen well without significant side effects. She has no neurologic complaints. She denies any peripheral neuropathy.  She denies any fevers.  She denies any chest pain, cough, or shortness of breath.  She denies any nausea, vomiting, constipation, or diarrhea.  She has no urinary complaints.  Patient offers no specific complaints today.  REVIEW OF SYSTEMS:   Review of Systems  Constitutional: Negative for fever and malaise/fatigue.  Respiratory: Negative.  Negative for cough and shortness of breath.   Cardiovascular: Negative.  Negative for chest pain and leg swelling.  Gastrointestinal: Negative.  Negative for abdominal pain.  Genitourinary: Negative.   Musculoskeletal: Negative.   Skin: Negative.  Negative for rash.  Neurological: Negative.  Negative for sensory change and weakness.  Psychiatric/Behavioral: Negative.  The patient is not nervous/anxious.     As per HPI. Otherwise, a complete review of systems is negative.  PAST MEDICAL HISTORY: Past Medical History:  Diagnosis Date  . ADD (attention deficit disorder)   . Breast cancer [facility]) 2013   right breast with lumpectomy and chemo and rad  . Hyperlipidemia   . Hypertension     PAST SURGICAL HISTORY: Past Surgical History:  Procedure Laterality Date  . ABDOMINAL HYSTERECTOMY    . BREAST BIOPSY Right 2013   positive  . MASTECTOMY PARTIAL / LUMPECTOMY      FAMILY HISTORY: Paternal grandmother with breast cancer, hypertension.     ADVANCED  DIRECTIVES:    HEALTH MAINTENANCE: Social History  Substance Use Topics  . Smoking status: Former Research scientist (life sciences)  . Smokeless tobacco: Never Used  . Alcohol use No     Allergies  Allergen Reactions  . No Known Allergies     Current Outpatient Prescriptions  Medication Sig Dispense Refill  . amphetamine-dextroamphetamine (ADDERALL) 20 MG tablet TK 1 T PO BID  0  . BIOTIN 5000 PO Take 1 Dose by mouth.    . Cholecalciferol (VITAMIN D3) 1000 UNITS CAPS Take by mouth.    . Cyanocobalamin (VITAMIN B-12) 500 MCG SUBL Place under the tongue.    Marland Kitchen glycopyrrolate (ROBINUL) 2 MG tablet     . losartan-hydrochlorothiazide (HYZAAR) 100-25 MG per tablet     . tamoxifen (NOLVADEX) 20 MG tablet Take 1 tablet by mouth  daily 90 tablet 3   No current facility-administered medications for this visit.     OBJECTIVE: Vitals:   08/27/17 1505  BP: 130/84  Pulse: (!) 106  Temp: 98.1 F (36.7 C)     Body mass index is 24.43 kg/m.    ECOG FS:0 - Asymptomatic  General: Well-developed, well-nourished, no acute distress. Eyes: Pink conjunctiva, anicteric sclera. Breasts: Patient reports a normal breast exam by another physician in the past several weeks. Lungs: Clear to auscultation bilaterally. Heart: Regular rate and rhythm. No rubs, murmurs, or gallops. Abdomen: Soft, nontender, nondistended. No organomegaly noted, normoactive bowel sounds. Musculoskeletal: No edema, cyanosis, or clubbing. Neuro: Alert, answering all questions appropriately. Cranial nerves grossly intact. Skin: No rashes or petechiae noted. Psych: Normal affect.  LAB RESULTS:  Lab Results  Component Value  Date   NA 136 12/09/2013   K 3.1 (L) 12/09/2013   CL 99 12/09/2013   CO2 28 12/09/2013   GLUCOSE 107 (H) 12/09/2013   BUN 12 12/09/2013   CREATININE 0.69 12/09/2013   CALCIUM 9.2 12/09/2013   PROT 6.9 12/09/2013   ALBUMIN 3.7 12/09/2013   AST 18 12/09/2013   ALT 25 12/09/2013   ALKPHOS 66 12/09/2013   BILITOT 0.5  12/09/2013   GFRNONAA >60 12/09/2013   GFRAA >60 12/09/2013    Lab Results  Component Value Date   WBC 7.2 12/09/2013   NEUTROABS 4.4 12/09/2013   HGB 13.9 12/09/2013   HCT 40.4 12/09/2013   MCV 93 12/09/2013   PLT 232 12/09/2013   Lab Results  Component Value Date   LABCA2 13.5 11/17/2014     STUDIES: No results found.  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  BRCA negative.  PLAN:    1.  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 BRCA negative:  No evidence of disease. Patient's CA-27-29 was checked at [place] and continues to be within normal limits. Continue tamoxifen for a total 5 years completing in November 2019. Although patient has expressed interest in continuing treatment for a total of 7-10 years which is reasonable given her premenopausal status. Upon nearing completion of her 5 years of tamoxifen will order [facility] and LH to determine whether patient is pre- or postmenopausal since she has had a hysterectomy. If she is postmenopausal, will switch her to Aromasin.  Patient's most recent mammogram on December 30, 2016 was reported as BI-RADS 2, repeat in January 2019.  Return to clinic in 6 months for routine evaluation.  Approximately 20 minutes was spent in discussion of which greater than 50% was consultation.  Patient expressed understanding and was in agreement with this plan. She also understands that She can call clinic at any time with any questions, concerns, or complaints.    Lloyd Huger, MD   08/29/2017 4:34 PM

## 2017-08-27 ENCOUNTER — Inpatient Hospital Stay: Payer: 59 | Attending: Oncology | Admitting: Oncology

## 2017-08-27 VITALS — BP 130/84 | HR 106 | Temp 98.1°F | Wt 145.0 lb

## 2017-08-27 DIAGNOSIS — Z87891 Personal history of nicotine dependence: Secondary | ICD-10-CM | POA: Insufficient documentation

## 2017-08-27 DIAGNOSIS — Z923 Personal history of irradiation: Secondary | ICD-10-CM | POA: Diagnosis not present

## 2017-08-27 DIAGNOSIS — I10 Essential (primary) hypertension: Secondary | ICD-10-CM | POA: Insufficient documentation

## 2017-08-27 DIAGNOSIS — Z9221 Personal history of antineoplastic chemotherapy: Secondary | ICD-10-CM | POA: Insufficient documentation

## 2017-08-27 DIAGNOSIS — E785 Hyperlipidemia, unspecified: Secondary | ICD-10-CM | POA: Insufficient documentation

## 2017-08-27 DIAGNOSIS — D0511 Intraductal carcinoma in situ of right breast: Secondary | ICD-10-CM | POA: Diagnosis not present

## 2017-08-27 DIAGNOSIS — Z9011 Acquired absence of right breast and nipple: Secondary | ICD-10-CM | POA: Insufficient documentation

## 2017-08-27 DIAGNOSIS — Z79810 Long term (current) use of selective estrogen receptor modulators (SERMs): Secondary | ICD-10-CM | POA: Insufficient documentation

## 2017-08-27 DIAGNOSIS — Z170 Estrogen receptor positive status [ER+]: Secondary | ICD-10-CM | POA: Diagnosis not present

## 2017-08-27 DIAGNOSIS — C50411 Malignant neoplasm of upper-outer quadrant of right female breast: Secondary | ICD-10-CM

## 2017-08-27 DIAGNOSIS — Z79899 Other long term (current) drug therapy: Secondary | ICD-10-CM | POA: Diagnosis not present

## 2017-08-27 NOTE — Progress Notes (Signed)
Pt reports new job at [employer] somewhat stressful.  Was unable to get blood drawn for today's visit request order to take to [facility] to have drawn.  Pt also states had breast exam 2 months ago with Dr Tamala Julian.

## 2017-09-15 ENCOUNTER — Encounter: Payer: Self-pay | Admitting: Oncology

## 2017-09-30 DIAGNOSIS — I10 Essential (primary) hypertension: Secondary | ICD-10-CM | POA: Diagnosis not present

## 2017-10-12 ENCOUNTER — Other Ambulatory Visit: Payer: Self-pay | Admitting: *Deleted

## 2017-10-12 MED ORDER — TAMOXIFEN CITRATE 20 MG PO TABS: 20.0000 mg | ORAL_TABLET | Freq: Every day | ORAL | 1 refills | Status: DC

## 2017-11-13 ENCOUNTER — Other Ambulatory Visit: Payer: Self-pay | Admitting: Surgery

## 2017-11-13 DIAGNOSIS — Z853 Personal history of malignant neoplasm of breast: Secondary | ICD-10-CM

## 2017-12-05 ENCOUNTER — Other Ambulatory Visit: Payer: Self-pay | Admitting: Oncology

## 2017-12-31 ENCOUNTER — Ambulatory Visit
Admission: RE | Admit: 2017-12-31 | Discharge: 2017-12-31 | Disposition: A | Discharge status: Home / Self Care | Payer: Managed Care, Other (non HMO) | Source: Ambulatory Visit | Attending: Surgery | Admitting: Surgery

## 2017-12-31 DIAGNOSIS — Z853 Personal history of malignant neoplasm of breast: Secondary | ICD-10-CM | POA: Diagnosis not present

## 2017-12-31 DIAGNOSIS — Z08 Encounter for follow-up examination after completed treatment for malignant neoplasm: Secondary | ICD-10-CM | POA: Insufficient documentation

## 2017-12-31 HISTORY — DX: Personal history of antineoplastic chemotherapy: Z92.21

## 2017-12-31 HISTORY — DX: Personal history of irradiation: Z92.3

## 2018-01-06 ENCOUNTER — Other Ambulatory Visit: Payer: Self-pay | Admitting: Oncology

## 2018-02-06 ENCOUNTER — Other Ambulatory Visit: Payer: Self-pay | Admitting: Oncology

## 2018-02-21 NOTE — Progress Notes (Signed)
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  BRCA negative  INTERVAL HISTORY: Patient returns to clinic today for routine 6 month followup. She continues to tolerate tamoxifen well without significant side effects. She has no neurologic complaints. She denies any peripheral neuropathy.  She denies any recent fevers or illnesses.  She denies any chest pain, cough, or shortness of breath.  She denies any nausea, vomiting, constipation, or diarrhea.  She has no urinary complaints.  Patient feels at her baseline and offers no specific complaints today.  REVIEW OF SYSTEMS:   Review of Systems  Constitutional: Negative for fever and malaise/fatigue.  Respiratory: Negative.  Negative for cough and shortness of breath.   Cardiovascular: Negative.  Negative for chest pain and leg swelling.  Gastrointestinal: Negative.  Negative for abdominal pain.  Genitourinary: Negative.   Musculoskeletal: Negative.   Skin: Negative.  Negative for rash.  Neurological: Negative.  Negative for sensory change, focal weakness and weakness.  Psychiatric/Behavioral: Negative.  The patient is not nervous/anxious.     As per HPI. Otherwise, a complete review of systems is negative.  PAST MEDICAL HISTORY: Past Medical History:  Diagnosis Date  . ADD (attention deficit disorder)   . Breast cancer [facility]) 2013   right breast with lumpectomy and chemo and rad  . Hyperlipidemia   . Hypertension   . Personal history of chemotherapy   . Personal history of radiation therapy     PAST SURGICAL HISTORY: Past Surgical History:  Procedure Laterality Date  . ABDOMINAL HYSTERECTOMY    . BREAST BIOPSY Right 2013   positive  . BREAST LUMPECTOMY Right 2013    FAMILY HISTORY: Paternal grandmother with breast cancer, hypertension.     ADVANCED DIRECTIVES:    HEALTH MAINTENANCE: Social History   Tobacco Use  . Smoking status: Former Research scientist (life sciences)  . Smokeless tobacco: Never Used  Substance Use Topics  . Alcohol use: No  . Drug use: No     Allergies  Allergen Reactions  . No Known Allergies     Current Outpatient Medications  Medication Sig Dispense Refill  . amphetamine-dextroamphetamine (ADDERALL) 20 MG tablet TK 1 T PO BID  0  . BIOTIN 5000 PO Take 1 Dose by mouth.    . Cholecalciferol (VITAMIN D3) 1000 UNITS CAPS Take by mouth.    . Cyanocobalamin (VITAMIN B-12) 500 MCG SUBL Place under the tongue.    Marland Kitchen glycopyrrolate (ROBINUL) 2 MG tablet     . losartan-hydrochlorothiazide (HYZAAR) 100-25 MG per tablet     . PAZEO 0.7 % SOLN     . tamoxifen (NOLVADEX) 20 MG tablet TAKE 1 TABLET(20 MG) BY MOUTH DAILY 30 tablet 0   No current facility-administered medications for this visit.     OBJECTIVE: Vitals:   02/25/18 1513  BP: 129/86  Pulse: 86     Body mass index is 24.55 kg/m.    ECOG FS:0 - Asymptomatic  General: Well-developed, well-nourished, no acute distress. Eyes: Pink conjunctiva, anicteric sclera. Breasts: Patient reports a normal breast exam by another physician in the past several weeks. Lungs: Clear to auscultation bilaterally. Heart: Regular rate and rhythm. No rubs, murmurs, or gallops. Abdomen: Soft, nontender, nondistended. No organomegaly noted, normoactive bowel sounds. Musculoskeletal:  No edema, cyanosis, or clubbing. Neuro: Alert, answering all questions appropriately. Cranial nerves grossly intact. Skin: No rashes or petechiae noted. Psych: Normal affect.  LAB RESULTS:  Lab Results  Component Value Date   NA 136 12/09/2013   K 3.1 (L) 12/09/2013   CL 99 12/09/2013   CO2 28 12/09/2013   GLUCOSE 107 (H) 12/09/2013   BUN 12 12/09/2013   CREATININE 0.69  12/09/2013   CALCIUM 9.2 12/09/2013   PROT 6.9 12/09/2013   ALBUMIN 3.7 12/09/2013   AST 18 12/09/2013   ALT 25 12/09/2013   ALKPHOS 66 12/09/2013   BILITOT 0.5 12/09/2013   GFRNONAA >60 12/09/2013   GFRAA >60 12/09/2013    Lab Results  Component Value Date   WBC 7.2 12/09/2013   NEUTROABS 4.4 12/09/2013   HGB 13.9 12/09/2013   HCT 40.4 12/09/2013   MCV 93 12/09/2013   PLT 232 12/09/2013   Lab Results  Component Value Date   LABCA2 13.5 11/17/2014     STUDIES: No results found.  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  BRCA negative.  PLAN:    1.  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 BRCA negative:  No evidence of disease. Patient's CA-27-29 was checked at [facility] and continues to be within normal limits. Continue tamoxifen for a total 5 years completing in November 2019. Although patient has expressed interest in continuing treatment for a total of 7-10 years which is reasonable given her premenopausal status. Will order [facility] and LH with her next lab draw to determine whether patient is pre- or postmenopausal since she has had a hysterectomy. If she is postmenopausal, will switch her to Aromasin.  Patient's most recent mammogram on December 31, 2017 was reported as BI-RADS 2, repeat in January 2020.  Return to clinic in 6 months for routine evaluation.  Approximately 20 minutes was spent in discussion of which greater than 50% was consultation.  Patient expressed understanding and was in agreement with this plan. She also understands that She can call clinic at any time with any questions, concerns, or complaints.    Lloyd Huger, MD   03/01/2018 8:37 PM

## 2018-02-25 ENCOUNTER — Encounter: Payer: Self-pay | Admitting: Oncology

## 2018-02-25 ENCOUNTER — Other Ambulatory Visit: Payer: Self-pay

## 2018-02-25 ENCOUNTER — Inpatient Hospital Stay: Payer: Managed Care, Other (non HMO) | Attending: Oncology | Admitting: Oncology

## 2018-02-25 VITALS — BP 129/86 | HR 86 | Wt 145.7 lb

## 2018-02-25 DIAGNOSIS — Z170 Estrogen receptor positive status [ER+]: Secondary | ICD-10-CM | POA: Diagnosis not present

## 2018-02-25 DIAGNOSIS — Z79810 Long term (current) use of selective estrogen receptor modulators (SERMs): Secondary | ICD-10-CM | POA: Insufficient documentation

## 2018-02-25 DIAGNOSIS — C50411 Malignant neoplasm of upper-outer quadrant of right female breast: Secondary | ICD-10-CM

## 2018-02-25 DIAGNOSIS — D0511 Intraductal carcinoma in situ of right breast: Secondary | ICD-10-CM

## 2018-04-19 ENCOUNTER — Encounter: Payer: Self-pay | Admitting: Oncology

## 2018-08-12 ENCOUNTER — Other Ambulatory Visit: Payer: Self-pay | Admitting: Oncology

## 2018-08-30 NOTE — Progress Notes (Signed)
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  BRCA negative  INTERVAL HISTORY: Patient returns to clinic today for routine six-month follow-up.  She continues to tolerate tamoxifen well without significant side effects. She has no neurologic complaints. She denies any peripheral neuropathy.  She denies any recent fevers or illnesses.  She denies any chest pain, cough, or shortness of breath.  She denies any nausea, vomiting, constipation, or diarrhea.  She has no urinary complaints.  Patient feels at her baseline offers no specific complaints today.  REVIEW OF SYSTEMS:   Review of Systems  Constitutional: Negative for fever and malaise/fatigue.  Respiratory: Negative.  Negative for cough and shortness of breath.   Cardiovascular: Negative.  Negative for chest pain and leg swelling.  Gastrointestinal: Negative.  Negative for abdominal pain.  Genitourinary: Negative.  Negative for dysuria.  Musculoskeletal: Negative.  Negative for back pain.  Skin: Negative.  Negative for rash.  Neurological: Negative.  Negative for sensory change, focal weakness, weakness and headaches.  Psychiatric/Behavioral: Negative.  The patient is not nervous/anxious.     As per HPI. Otherwise, a complete review of systems is negative.  PAST MEDICAL HISTORY: Past Medical History:  Diagnosis Date  . ADD (attention deficit disorder)   . Breast cancer [facility]) 2013   right breast with lumpectomy and chemo and rad  . Hyperlipidemia   . Hypertension   . Personal history of chemotherapy   . Personal history of radiation therapy     PAST SURGICAL HISTORY: Past Surgical History:  Procedure Laterality Date  .  ABDOMINAL HYSTERECTOMY    . BREAST BIOPSY Right 2013   positive  . BREAST LUMPECTOMY Right 2013    FAMILY HISTORY: Paternal grandmother with breast cancer, hypertension.     ADVANCED DIRECTIVES:    HEALTH MAINTENANCE: Social History   Tobacco Use  . Smoking status: Former Research scientist (life sciences)  . Smokeless tobacco: Never Used  Substance Use Topics  . Alcohol use: No  . Drug use: No     Allergies  Allergen Reactions  . No Known Allergies     Current Outpatient Medications  Medication Sig Dispense Refill  . amphetamine-dextroamphetamine (ADDERALL) 20 MG tablet TK 1 T PO BID  0  . BIOTIN 5000 PO Take 1 Dose by mouth.    . Cholecalciferol (VITAMIN D3) 1000 UNITS CAPS Take by mouth.    . Cyanocobalamin (VITAMIN B-12) 500 MCG SUBL Place under the tongue.    Marland Kitchen glycopyrrolate (ROBINUL) 2 MG tablet     . losartan-hydrochlorothiazide (HYZAAR) 100-25 MG per tablet     . tamoxifen (NOLVADEX) 20 MG tablet TAKE 1 TABLET BY MOUTH DAILY 90 tablet 0  . PAZEO 0.7 % SOLN      No current facility-administered medications for this visit.     OBJECTIVE: Vitals:   08/31/18 1455  BP: 116/82  Pulse: 96  Resp: 18  Temp: (!) 96.7 F (35.9 C)     Body mass index is 23.96 kg/m.    ECOG FS:0 - Asymptomatic  General: Well-developed, well-nourished, no acute distress. Eyes: Pink conjunctiva, anicteric sclera. HEENT: Normocephalic, moist mucous membranes. Breast: Patient declined breast exam today. Lungs: Clear to auscultation bilaterally. Heart: Regular rate and rhythm. No rubs, murmurs,  or gallops. Abdomen: Soft, nontender, nondistended. No organomegaly noted, normoactive bowel sounds. Musculoskeletal: No edema, cyanosis, or clubbing. Neuro: Alert, answering all questions appropriately. Cranial nerves grossly intact. Skin: No rashes or petechiae noted. Psych: Normal affect.  LAB RESULTS:  Lab Results  Component Value Date   NA 136 12/09/2013   K 3.1 (L) 12/09/2013   CL 99 12/09/2013   CO2  28 12/09/2013   GLUCOSE 107 (H) 12/09/2013   BUN 12 12/09/2013   CREATININE 0.69 12/09/2013   CALCIUM 9.2 12/09/2013   PROT 6.9 12/09/2013   ALBUMIN 3.7 12/09/2013   AST 18 12/09/2013   ALT 25 12/09/2013   ALKPHOS 66 12/09/2013   BILITOT 0.5 12/09/2013   GFRNONAA >60 12/09/2013   GFRAA >60 12/09/2013    Lab Results  Component Value Date   WBC 7.2 12/09/2013   NEUTROABS 4.4 12/09/2013   HGB 13.9 12/09/2013   HCT 40.4 12/09/2013   MCV 93 12/09/2013   PLT 232 12/09/2013   Lab Results  Component Value Date   LABCA2 13.5 11/17/2014     STUDIES: No results found.  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  BRCA negative.  PLAN:    1.  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 BRCA negative:  No evidence of disease. Patient's CA-27-29 was checked at [place] and continues to be within normal limits.  Although patient had no negative pathologic disease at the time of her mastectomy, given her age and initial clinical stage she may benefit from an additional 2 years of tamoxifen.  LH and [place] have been drawn indicating the patient is still premenopausal.  Will continue tamoxifen for a total of 7 years completing in November 2021. Patient's most recent mammogram on December 31, 2017 was reported as BI-RADS 2, repeat in January 2020.  Return to clinic in 6 months with repeat laboratory work and further evaluation.    I spent a total of 20 minutes face-to-face with the patient of which greater than 50% of the visit was spent in counseling and coordination of care as detailed above.  Patient expressed understanding and was in agreement with this plan. She also understands that She can call clinic at any time with any questions, concerns, or complaints.    Lloyd Huger, MD   09/03/2018 4:22 PM

## 2018-08-31 ENCOUNTER — Encounter: Payer: Self-pay | Admitting: Oncology

## 2018-08-31 ENCOUNTER — Inpatient Hospital Stay: Payer: Managed Care, Other (non HMO) | Attending: Oncology | Admitting: Oncology

## 2018-08-31 ENCOUNTER — Other Ambulatory Visit: Payer: Self-pay

## 2018-08-31 VITALS — BP 116/82 | HR 96 | Temp 96.7°F | Resp 18 | Wt 142.2 lb

## 2018-08-31 DIAGNOSIS — Z170 Estrogen receptor positive status [ER+]: Secondary | ICD-10-CM | POA: Diagnosis not present

## 2018-08-31 DIAGNOSIS — D0511 Intraductal carcinoma in situ of right breast: Secondary | ICD-10-CM

## 2018-08-31 DIAGNOSIS — Z79810 Long term (current) use of selective estrogen receptor modulators (SERMs): Secondary | ICD-10-CM

## 2018-08-31 DIAGNOSIS — C50411 Malignant neoplasm of upper-outer quadrant of right female breast: Secondary | ICD-10-CM

## 2018-08-31 NOTE — Progress Notes (Signed)
Patient here for follow up. No concerns voiced.  °

## 2018-09-03 ENCOUNTER — Telehealth: Payer: Self-pay | Admitting: *Deleted

## 2018-09-03 NOTE — Telephone Encounter (Signed)
Left vm for patient to call back regarding lab results. Per Dr. Grayland Ormond labs drawn this week show that patient is premenopausal at this time. Patient should continue to take Tamoxifen.

## 2018-10-06 ENCOUNTER — Telehealth: Payer: Self-pay | Admitting: *Deleted

## 2018-10-06 NOTE — Telephone Encounter (Signed)
Spoke to patient via telephone and told her that we will mail her the [place] form to check her labs prior to next visit. Patient verbalized understanding.

## 2018-10-06 NOTE — Telephone Encounter (Signed)
Patient left voice message on triage line for nurse to call her back at [phone] regarding her results.   Returned call to patient at the above number and left a voice message for patient to call me back in triage for results.     dhs  Spoke with patient via telephone and gave her the lab results and Dr. Gary Fleet recommendation to stay on the Tamoxifen. She wants to know when we will recheck her labs. She is coming for follow up in April and needs [facility] paperwork if we will recheck prior to her visit. Please advise.     dhs

## 2018-10-06 NOTE — Telephone Encounter (Signed)
Yes, recheck in 6 months prior to next appt.

## 2018-11-08 ENCOUNTER — Other Ambulatory Visit: Payer: Self-pay | Admitting: Oncology

## 2019-01-03 ENCOUNTER — Ambulatory Visit
Admission: RE | Admit: 2019-01-03 | Discharge: 2019-01-03 | Disposition: A | Discharge status: Home / Self Care | Payer: Managed Care, Other (non HMO) | Source: Ambulatory Visit | Attending: Oncology | Admitting: Oncology

## 2019-01-03 DIAGNOSIS — C50411 Malignant neoplasm of upper-outer quadrant of right female breast: Secondary | ICD-10-CM | POA: Diagnosis not present

## 2019-01-04 ENCOUNTER — Other Ambulatory Visit: Payer: Self-pay | Admitting: Oncology

## 2019-01-04 DIAGNOSIS — R921 Mammographic calcification found on diagnostic imaging of breast: Secondary | ICD-10-CM

## 2019-01-04 DIAGNOSIS — R928 Other abnormal and inconclusive findings on diagnostic imaging of breast: Secondary | ICD-10-CM

## 2019-01-12 ENCOUNTER — Ambulatory Visit
Admission: RE | Admit: 2019-01-12 | Discharge: 2019-01-12 | Disposition: A | Discharge status: Home / Self Care | Payer: Managed Care, Other (non HMO) | Source: Ambulatory Visit | Attending: Oncology | Admitting: Oncology

## 2019-01-12 DIAGNOSIS — R921 Mammographic calcification found on diagnostic imaging of breast: Secondary | ICD-10-CM | POA: Diagnosis present

## 2019-01-12 DIAGNOSIS — R928 Other abnormal and inconclusive findings on diagnostic imaging of breast: Secondary | ICD-10-CM | POA: Diagnosis present

## 2019-02-10 ENCOUNTER — Other Ambulatory Visit: Payer: Self-pay | Admitting: Oncology

## 2019-03-08 ENCOUNTER — Ambulatory Visit: Payer: Managed Care, Other (non HMO) | Admitting: Oncology

## 2019-04-14 ENCOUNTER — Other Ambulatory Visit: Payer: Self-pay

## 2019-04-14 NOTE — Progress Notes (Addendum)
[name]  Telephone:(336) [phone] Fax:(336) [phone]  ID: Mikayla Romero OB: 1971/01/16  MR#: [id number]  HGD#:[id number]  Patient Care Team: [place] Pink, MD as PCP - General (Family Medicine)  I connected with Mikayla Romero on 04/15/19 at  8:45 AM EDT by video enabled telemedicine visit and verified that I am speaking with the correct person using two identifiers.   I discussed the limitations, risks, security and privacy concerns of performing an evaluation and management service by telemedicine and the availability of in-person appointments. I also discussed with the patient that there may be a patient responsible charge related to this service. The patient expressed understanding and agreed to proceed.   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  BRCA negative  INTERVAL HISTORY: Patient agreed to video enabled telemedicine visit for her routine 57-monthfollow-up.  She currently feels well and is asymptomatic.  She is tolerating tamoxifen without significant side effects. She has no neurologic complaints. She denies any peripheral neuropathy.  She denies any recent fevers or illnesses.  She denies any chest pain, shortness of breath, cough, or hemoptysis.  She denies any nausea, vomiting, constipation, or diarrhea.  She has no urinary complaints.  Patient feels at her baseline offers no specific complaints today.  REVIEW OF SYSTEMS:   Review of Systems  Constitutional: Negative.  Negative for fever and malaise/fatigue.  Respiratory: Negative.  Negative for cough and shortness of breath.   Cardiovascular: Negative.  Negative for chest pain and leg swelling.  Gastrointestinal: Negative.  Negative for abdominal pain.  Genitourinary:  Negative.  Negative for dysuria.  Musculoskeletal: Negative.  Negative for back pain.  Skin: Negative.  Negative for rash.  Neurological: Negative.  Negative for sensory change, focal weakness, weakness and headaches.  Psychiatric/Behavioral: Negative.  The patient is not nervous/anxious.     As per HPI. Otherwise, a complete review of systems is negative.  PAST MEDICAL HISTORY: Past Medical History:  Diagnosis Date  . ADD (attention deficit disorder)   . Breast cancer ([facility] 2013   right breast with lumpectomy and chemo and rad  . Hyperlipidemia   . Hypertension   . Personal history of chemotherapy   . Personal history of radiation therapy     PAST SURGICAL HISTORY: Past Surgical History:  Procedure Laterality Date  . ABDOMINAL HYSTERECTOMY    . BREAST BIOPSY Right 2013   positive  . BREAST LUMPECTOMY Right 2013    FAMILY HISTORY: Paternal grandmother with breast cancer, hypertension.     ADVANCED DIRECTIVES:    HEALTH MAINTENANCE: Social History   Tobacco Use  . Smoking status: Former SResearch scientist (life sciences) . Smokeless tobacco: Never Used  Substance Use Topics  . Alcohol use: No  . Drug use: No     Allergies  Allergen Reactions  . No Known Allergies     Current Outpatient Medications  Medication Sig Dispense Refill  . amphetamine-dextroamphetamine (ADDERALL) 20 MG tablet TK 1 T PO BID  0  . BIOTIN 5000 PO Take 1 Dose by mouth.    . Cholecalciferol (VITAMIN D3) 1000 UNITS CAPS Take by mouth.    .Marland Kitchenglycopyrrolate (ROBINUL) 2 MG tablet     . losartan-hydrochlorothiazide (HYZAAR) 100-25 MG per tablet     . tamoxifen (NOLVADEX) 20 MG tablet TAKE  1 TABLET BY MOUTH DAILY 90 tablet 0   No current facility-administered medications for this visit.     OBJECTIVE: There were no vitals filed for this visit.   There is no height or weight on file to calculate BMI.    ECOG FS:0 - Asymptomatic  General: Well-developed, well-nourished, no acute distress. HEENT: Normocephalic.  Neuro: Alert, answering all questions appropriately. Cranial nerves grossly intact. Skin: No rashes or petechiae noted. Psych: Normal affect.  LAB RESULTS:  Lab Results  Component Value Date   NA 136 12/09/2013   K 3.1 (L) 12/09/2013   CL 99 12/09/2013   CO2 28 12/09/2013   GLUCOSE 107 (H) 12/09/2013   BUN 12 12/09/2013   CREATININE 0.69 12/09/2013   CALCIUM 9.2 12/09/2013   PROT 6.9 12/09/2013   ALBUMIN 3.7 12/09/2013   AST 18 12/09/2013   ALT 25 12/09/2013   ALKPHOS 66 12/09/2013   BILITOT 0.5 12/09/2013   GFRNONAA >60 12/09/2013   GFRAA >60 12/09/2013    Lab Results  Component Value Date   WBC 7.2 12/09/2013   NEUTROABS 4.4 12/09/2013   HGB 13.9 12/09/2013   HCT 40.4 12/09/2013   MCV 93 12/09/2013   PLT 232 12/09/2013   Lab Results  Component Value Date   LABCA2 13.5 11/17/2014     STUDIES: No results found.  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  BRCA negative.  PLAN:    1.  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 BRCA negative: Patient underwent neoadjuvant chemotherapy followed by total mastectomy on June 13, 2013.  Final pathology results revealed residual DCIS without invasive component and 0/4 lymph nodes negative for disease.  She did not require adjuvant XRT. Patient's CA 27-29 continues to be within normal limits, today's result is pending at time of dictation.  Previously after lengthy discussion with the patient, it was agreed upon to do at least 2 additional years of tamoxifen for a total of 7 years of treatment completing in November 2021.  Her most recent mammogram on January 12, 2019 was reported as BI-RADS 3 and recommendation was to repeat in 6 months.  Return to clinic in 6 months for routine evaluation.    I provided 15 minutes of face-to-face video visit time during this encounter,  and > 50% was spent counseling as documented under my assessment & plan.   Patient expressed understanding and was in agreement with this plan. She also understands that She can call clinic at any time with any questions, concerns, or complaints.    Lloyd Huger, MD   04/15/2019 9:21 AM

## 2019-04-15 ENCOUNTER — Encounter: Payer: Self-pay | Admitting: Oncology

## 2019-04-15 ENCOUNTER — Other Ambulatory Visit: Payer: Self-pay

## 2019-04-15 ENCOUNTER — Inpatient Hospital Stay: Payer: Managed Care, Other (non HMO) | Attending: Oncology | Admitting: Oncology

## 2019-04-15 DIAGNOSIS — C50411 Malignant neoplasm of upper-outer quadrant of right female breast: Secondary | ICD-10-CM | POA: Diagnosis not present

## 2019-04-15 DIAGNOSIS — Z79899 Other long term (current) drug therapy: Secondary | ICD-10-CM

## 2019-04-15 DIAGNOSIS — Z923 Personal history of irradiation: Secondary | ICD-10-CM

## 2019-04-15 DIAGNOSIS — I10 Essential (primary) hypertension: Secondary | ICD-10-CM

## 2019-04-15 DIAGNOSIS — Z90710 Acquired absence of both cervix and uterus: Secondary | ICD-10-CM

## 2019-04-15 DIAGNOSIS — Z170 Estrogen receptor positive status [ER+]: Secondary | ICD-10-CM

## 2019-04-15 DIAGNOSIS — Z87891 Personal history of nicotine dependence: Secondary | ICD-10-CM

## 2019-04-15 DIAGNOSIS — Z9221 Personal history of antineoplastic chemotherapy: Secondary | ICD-10-CM | POA: Diagnosis not present

## 2019-04-15 NOTE — Progress Notes (Signed)
Patient stated that she had been doing well with no complaints. Patient stated that she just had her labs drawn yesterday at [facility]. Patient denied breast discoloration, nipple discharge and/or pain Patient had her last mammogram done on 01/12/2019 and the radiologist recommended to have another diagnostic mammogram in August 2020 to make sure what was seen it was benign.

## 2019-04-19 ENCOUNTER — Encounter: Payer: Self-pay | Admitting: Oncology

## 2019-05-14 ENCOUNTER — Other Ambulatory Visit: Payer: Self-pay | Admitting: Oncology

## 2019-07-14 ENCOUNTER — Ambulatory Visit
Admission: RE | Admit: 2019-07-14 | Discharge: 2019-07-14 | Disposition: A | Discharge status: Home / Self Care | Payer: Managed Care, Other (non HMO) | Source: Ambulatory Visit | Attending: Oncology | Admitting: Oncology

## 2019-07-14 ENCOUNTER — Other Ambulatory Visit: Payer: Self-pay | Admitting: Oncology

## 2019-07-14 DIAGNOSIS — C50411 Malignant neoplasm of upper-outer quadrant of right female breast: Secondary | ICD-10-CM | POA: Insufficient documentation

## 2019-08-18 DIAGNOSIS — Z85820 Personal history of malignant melanoma of skin: Secondary | ICD-10-CM | POA: Insufficient documentation

## 2019-08-19 DIAGNOSIS — C4371 Malignant melanoma of right lower limb, including hip: Secondary | ICD-10-CM | POA: Insufficient documentation

## 2019-10-14 NOTE — Progress Notes (Deleted)
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  BRCA negative  INTERVAL HISTORY: Patient agreed to video enabled telemedicine visit for her routine 50-monthfollow-up.  She currently feels well and is asymptomatic.  She is tolerating tamoxifen without significant side effects. She has no neurologic complaints. She denies any peripheral neuropathy.  She denies any recent fevers or illnesses.  She denies any chest pain, shortness of breath, cough, or hemoptysis.  She denies any nausea, vomiting, constipation, or diarrhea.  She has no urinary complaints.  Patient feels at her baseline offers no specific complaints today.  REVIEW OF SYSTEMS:   Review of Systems  Constitutional: Negative.  Negative for fever and malaise/fatigue.  Respiratory: Negative.  Negative for cough and shortness of breath.   Cardiovascular: Negative.  Negative for chest pain and leg swelling.  Gastrointestinal: Negative.  Negative for abdominal pain.  Genitourinary: Negative.  Negative for dysuria.  Musculoskeletal: Negative.  Negative for back pain.  Skin: Negative.  Negative for rash.  Neurological: Negative.  Negative for sensory change, focal weakness, weakness and headaches.  Psychiatric/Behavioral: Negative.  The patient is not nervous/anxious.     As per HPI. Otherwise, a complete review of systems is negative.  PAST MEDICAL HISTORY: Past Medical History:  Diagnosis Date  . ADD (attention deficit disorder)   . Breast cancer ([facility] 2013   right breast with lumpectomy and chemo and rad  . Hyperlipidemia   . Hypertension   . Personal history of chemotherapy 2014   Right breast ca  . Personal history of  radiation therapy 2014   right breast ca    PAST SURGICAL HISTORY: Past Surgical History:  Procedure Laterality Date  . ABDOMINAL HYSTERECTOMY    . BREAST BIOPSY Right 2013   DCIS  . BREAST LUMPECTOMY Right 2013   DCIS    FAMILY HISTORY: Paternal grandmother with breast cancer, hypertension.     ADVANCED DIRECTIVES:    HEALTH MAINTENANCE: Social History   Tobacco Use  . Smoking status: Former SResearch scientist (life sciences) . Smokeless tobacco: Never Used  Substance Use Topics  . Alcohol use: No  . Drug use: No     Allergies  Allergen Reactions  . No Known Allergies     Current Outpatient Medications  Medication Sig Dispense Refill  . amphetamine-dextroamphetamine (ADDERALL) 20 MG tablet TK 1 T PO BID  0  . BIOTIN 5000 PO Take 1 Dose by mouth.    . Cholecalciferol (VITAMIN D3) 1000 UNITS CAPS Take by mouth.    .Marland Kitchenglycopyrrolate (ROBINUL) 2 MG tablet     . losartan-hydrochlorothiazide (HYZAAR) 100-25 MG per tablet     . tamoxifen (NOLVADEX) 20 MG tablet TAKE 1 TABLET BY MOUTH DAILY 90 tablet 3   No current facility-administered medications for this visit.     OBJECTIVE: There were no vitals filed for this visit.   There is no height or weight on file to calculate BMI.    ECOG FS:0 - Asymptomatic  General: Well-developed, well-nourished, no acute distress. HEENT: Normocephalic. Neuro: Alert, answering all questions appropriately. Cranial nerves grossly intact. Skin: No rashes or petechiae noted. Psych: Normal affect.  LAB RESULTS:  Lab Results  Component Value Date   NA 136 12/09/2013  K 3.1 (L) 12/09/2013   CL 99 12/09/2013   CO2 28 12/09/2013   GLUCOSE 107 (H) 12/09/2013   BUN 12 12/09/2013   CREATININE 0.69 12/09/2013   CALCIUM 9.2 12/09/2013   PROT 6.9 12/09/2013   ALBUMIN 3.7 12/09/2013   AST 18 12/09/2013   ALT 25 12/09/2013   ALKPHOS 66 12/09/2013   BILITOT 0.5 12/09/2013   GFRNONAA >60 12/09/2013   GFRAA >60 12/09/2013    Lab Results  Component Value  Date   WBC 7.2 12/09/2013   NEUTROABS 4.4 12/09/2013   HGB 13.9 12/09/2013   HCT 40.4 12/09/2013   MCV 93 12/09/2013   PLT 232 12/09/2013   Lab Results  Component Value Date   LABCA2 13.5 11/17/2014     STUDIES: No results found.  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  BRCA negative.  PLAN:    1.  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 BRCA negative: Patient underwent neoadjuvant chemotherapy followed by total mastectomy on June 13, 2013.  Final pathology results revealed residual DCIS without invasive component and 0/4 lymph nodes negative for disease.  She did not require adjuvant XRT. Patient's CA 27-29 continues to be within normal limits, today's result is pending at time of dictation.  Previously after lengthy discussion with the patient, it was agreed upon to do at least 2 additional years of tamoxifen for a total of 7 years of treatment completing in November 2021.  Her most recent mammogram on January 12, 2019 was reported as BI-RADS 3 and recommendation was to repeat in 6 months.  Return to clinic in 6 months for routine evaluation.      Patient expressed understanding and was in agreement with this plan. She also understands that She can call clinic at any time with any questions, concerns, or complaints.    Lloyd Huger, MD   10/14/2019 5:30 PM

## 2019-10-18 ENCOUNTER — Inpatient Hospital Stay: Payer: Managed Care, Other (non HMO) | Admitting: Oncology

## 2019-10-26 NOTE — Progress Notes (Signed)
[place]  Telephone:(336) [phone] Fax:(336) [phone]  ID: Wandalee Ferdinand OB: 10-04-1971  MR#: [id number]  YME#:[id number]  Patient Care Team: Maryland Pink, MD as PCP - General (Family Medicine)   I connected with Octavia Heir Musselman on 11/01/19 at  2:00 PM EST by video enabled telemedicine visit and verified that I am speaking with the correct person using two identifiers.   I discussed the limitations, risks, security and privacy concerns of performing an evaluation and management service by telemedicine and the availability of in-person appointments. I also discussed with the patient that there may be a patient responsible charge related to this service. The patient expressed understanding and agreed to proceed.   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  BRCA negative  INTERVAL HISTORY: Patient agreed to video enabled telemedicine visit for her routine 61-monthevaluation.  Since her last clinic visit she has noted to have a melanoma in her left lower extremity that has since been removed.  She developed a postoperative blood clot and is currently on Eliquis.  She otherwise feels well and is asymptomatic.  She continues to tolerate tamoxifen without significant side effects. She has no neurologic complaints. She denies any peripheral neuropathy.  She denies any recent fevers or illnesses.  She denies any chest pain, shortness of breath, cough, or hemoptysis.  She denies any nausea, vomiting, constipation, or diarrhea.  She has no urinary complaints.  Patient offers no further specific complaints today.  REVIEW OF SYSTEMS:   Review of Systems  Constitutional: Negative.  Negative for fever and malaise/fatigue.  Respiratory: Negative.   Negative for cough and shortness of breath.   Cardiovascular: Negative.  Negative for chest pain and leg swelling.  Gastrointestinal: Negative.  Negative for abdominal pain.  Genitourinary: Negative.  Negative for dysuria.  Musculoskeletal: Negative.  Negative for back pain.  Skin: Negative.  Negative for rash.  Neurological: Negative.  Negative for sensory change, focal weakness, weakness and headaches.  Psychiatric/Behavioral: Negative.  The patient is not nervous/anxious.     As per HPI. Otherwise, a complete review of systems is negative.  PAST MEDICAL HISTORY: Past Medical History:  Diagnosis Date  . ADD (attention deficit disorder)   . Breast cancer ([facility] 2013   right breast with lumpectomy and chemo and rad  . Hyperlipidemia   . Hypertension   . Personal history of chemotherapy 2014   Right breast ca  . Personal history of radiation therapy 2014   right breast ca    PAST SURGICAL HISTORY: Past Surgical History:  Procedure Laterality Date  . ABDOMINAL HYSTERECTOMY    . BREAST BIOPSY Right 2013   DCIS  . BREAST LUMPECTOMY Right 2013   DCIS    FAMILY HISTORY: Paternal grandmother with breast cancer, hypertension.     ADVANCED DIRECTIVES:    HEALTH MAINTENANCE: Social History   Tobacco Use  . Smoking status: Former SResearch scientist (life sciences) . Smokeless tobacco: Never Used  Substance Use Topics  . Alcohol use: No  . Drug use: No     Allergies  Allergen Reactions  . No Known Allergies     Current Outpatient Medications  Medication Sig Dispense Refill  . amphetamine-dextroamphetamine (ADDERALL) 20 MG tablet TK 1 T PO BID  0  . apixaban (ELIQUIS) 5 MG TABS  tablet Take 5 mg by mouth 2 (two) times daily.    . APPLE CIDER VINEGAR PO Take 450 mg by mouth.    Marland Kitchen BIOTIN 5000 PO Take 1 Dose by mouth.    . Cholecalciferol (VITAMIN D3) 1000 UNITS CAPS Take by mouth.    Marland Kitchen CINNAMON PO Take 2,000 mg by mouth.    Marland Kitchen glycopyrrolate (ROBINUL) 2 MG tablet     .  losartan-hydrochlorothiazide (HYZAAR) 100-25 MG per tablet     . tamoxifen (NOLVADEX) 20 MG tablet TAKE 1 TABLET BY MOUTH DAILY 90 tablet 3  . Turmeric 450 MG CAPS Take by mouth.    Marland Kitchen VITAMIN E PO Take 90 mg by mouth.    . Zinc 50 MG CAPS Take by mouth.     No current facility-administered medications for this visit.     OBJECTIVE: There were no vitals filed for this visit.   There is no height or weight on file to calculate BMI.    ECOG FS:0 - Asymptomatic  General: Well-developed, well-nourished, no acute distress. HEENT: Normocephalic. Neuro: Alert, answering all questions appropriately. Cranial nerves grossly intact. Psych: Normal affect.  LAB RESULTS:  Lab Results  Component Value Date   NA 136 12/09/2013   K 3.1 (L) 12/09/2013   CL 99 12/09/2013   CO2 28 12/09/2013   GLUCOSE 107 (H) 12/09/2013   BUN 12 12/09/2013   CREATININE 0.69 12/09/2013   CALCIUM 9.2 12/09/2013   PROT 6.9 12/09/2013   ALBUMIN 3.7 12/09/2013   AST 18 12/09/2013   ALT 25 12/09/2013   ALKPHOS 66 12/09/2013   BILITOT 0.5 12/09/2013   GFRNONAA >60 12/09/2013   GFRAA >60 12/09/2013    Lab Results  Component Value Date   WBC 7.2 12/09/2013   NEUTROABS 4.4 12/09/2013   HGB 13.9 12/09/2013   HCT 40.4 12/09/2013   MCV 93 12/09/2013   PLT 232 12/09/2013   Lab Results  Component Value Date   LABCA2 13.5 11/17/2014     STUDIES: No results found.  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  BRCA negative.  PLAN:    1.  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 BRCA negative: Patient underwent neoadjuvant chemotherapy followed by total mastectomy on June 13, 2013.  Final pathology results revealed residual DCIS without invasive component and 0/4 lymph nodes negative for disease.  She did not require adjuvant XRT. Patient's CA 27-29  continues to be within normal limits, today's result is pending at time of dictation.  Previously after lengthy discussion with the patient, it was agreed upon to do at least 2 additional years of tamoxifen for a total of 7 years of treatment completing in November 2021.  Her most recent mammogram on July 14, 2019 was reported as BI-RADS 3, recommendation was to repeat in 6 months in February 2021.  No further interventions are needed.  Patient will have video assisted telemedicine visit in 6 months for further evaluation. 2.  Melanoma: Unclear stage.  Patient reports that she had complete surgical resection. 3.  DVT: Likely secondary to postoperative immobility.  Although tamoxifen is a risk factor for developing DVTs, her current clot is likely unrelated and patient will proceed with treatment as above.  I provided 25 minutes of face-to-face video visit time during this encounter, and > 50% was spent counseling as documented under  my assessment & plan.\  Patient expressed understanding and was in agreement with this plan. She also understands that She can call clinic at any time with any questions, concerns, or complaints.    Lloyd Huger, MD   11/01/2019 12:25 PM

## 2019-10-26 NOTE — Progress Notes (Signed)
Patient is going to be virtual visit she had surgery and is on eliquis and wanted to let you know

## 2019-10-31 ENCOUNTER — Other Ambulatory Visit: Payer: Self-pay

## 2019-10-31 ENCOUNTER — Inpatient Hospital Stay: Payer: Managed Care, Other (non HMO) | Attending: Oncology | Admitting: Oncology

## 2019-10-31 DIAGNOSIS — Z9889 Other specified postprocedural states: Secondary | ICD-10-CM

## 2019-10-31 DIAGNOSIS — R921 Mammographic calcification found on diagnostic imaging of breast: Secondary | ICD-10-CM | POA: Diagnosis not present

## 2020-01-03 DIAGNOSIS — Z86718 Personal history of other venous thrombosis and embolism: Secondary | ICD-10-CM | POA: Insufficient documentation

## 2020-01-12 ENCOUNTER — Ambulatory Visit
Admission: RE | Admit: 2020-01-12 | Discharge: 2020-01-12 | Disposition: A | Discharge status: Home / Self Care | Payer: 59 | Source: Ambulatory Visit | Attending: Oncology | Admitting: Oncology

## 2020-01-12 DIAGNOSIS — Z9889 Other specified postprocedural states: Secondary | ICD-10-CM | POA: Insufficient documentation

## 2020-01-12 DIAGNOSIS — R921 Mammographic calcification found on diagnostic imaging of breast: Secondary | ICD-10-CM | POA: Insufficient documentation

## 2020-01-13 ENCOUNTER — Encounter: Payer: Self-pay | Admitting: Oncology

## 2020-01-16 ENCOUNTER — Encounter: Payer: Self-pay | Admitting: Oncology

## 2020-04-21 NOTE — Progress Notes (Signed)
[place]  Telephone:(336) [phone] Fax:(336) [phone]  ID: Mikayla Romero OB: Nov 23, 1971  MR#: [id number]  VOZ#:[id number]  Patient Care Team: [place] Pink, MD as PCP - General (Family Medicine) Lloyd Huger, MD as Consulting Physician (Oncology)   I connected with Mikayla Romero on 04/26/20 at  2:45 PM EDT by video enabled telemedicine visit and verified that I am speaking with the correct person using two identifiers.   I discussed the limitations, risks, security and privacy concerns of performing an evaluation and management service by telemedicine and the availability of in-person appointments. I also discussed with the patient that there may be a patient responsible charge related to this service. The patient expressed understanding and agreed to proceed.   Other persons participating in the visit and their role in the encounter: Patient, MD.  Patient's location: Home. Provider's location: Clinic.  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  BRCA negative  INTERVAL HISTORY: Patient agreed to video enabled telemedicine visit for routine 6-monthevaluation.  Since developing a DVT after melanoma surgery, patient elected to discontinue her tamoxifen.  She currently feels well and is asymptomatic. She has no neurologic complaints. She denies any peripheral neuropathy.  She denies any recent fevers or illnesses.  She has good appetite and denies weight loss.  She denies any chest pain, shortness of breath, cough, or hemoptysis.  She denies any nausea, vomiting, constipation, or diarrhea.  She has no urinary complaints.  Patient offers no specific complaints today.  REVIEW OF SYSTEMS:   Review of Systems  Constitutional: Negative.  Negative for fever and malaise/fatigue.  Respiratory: Negative.  Negative for cough and shortness of breath.   Cardiovascular: Negative.   Negative for chest pain and leg swelling.  Gastrointestinal: Negative.  Negative for abdominal pain.  Genitourinary: Negative.  Negative for dysuria.  Musculoskeletal: Negative.  Negative for back pain.  Skin: Negative.  Negative for rash.  Neurological: Negative.  Negative for sensory change, focal weakness, weakness and headaches.  Psychiatric/Behavioral: Negative.  The patient is not nervous/anxious.     As per HPI. Otherwise, a complete review of systems is negative.  PAST MEDICAL HISTORY: Past Medical History:  Diagnosis Date  . ADD (attention deficit disorder)   . Breast cancer ([facility] 2013   right breast with lumpectomy and chemo and rad  . Hyperlipidemia   . Hypertension   . Personal history of chemotherapy 2014   Right breast ca  . Personal history of radiation therapy 2014   right breast ca    PAST SURGICAL HISTORY: Past Surgical History:  Procedure Laterality Date  . ABDOMINAL HYSTERECTOMY    . BREAST BIOPSY Right 2013   DCIS  . BREAST LUMPECTOMY Right 2013   DCIS    FAMILY HISTORY: Paternal grandmother with breast cancer, hypertension.     ADVANCED DIRECTIVES:    HEALTH MAINTENANCE: Social History   Tobacco Use  . Smoking status: Former SResearch scientist (life sciences) . Smokeless tobacco: Never Used  Substance Use Topics  . Alcohol use: No  . Drug use: No     Allergies  Allergen Reactions  . No Known Allergies     Current Outpatient Medications  Medication Sig Dispense Refill  . amphetamine-dextroamphetamine (ADDERALL) 20 MG tablet TK 1 T PO BID  0  . apixaban (ELIQUIS) 5 MG TABS tablet Take 5 mg by mouth 2 (two) times daily.    . APPLE CIDER  VINEGAR PO Take 450 mg by mouth.    Marland Kitchen BIOTIN 5000 PO Take 1 Dose by mouth.    . Cholecalciferol (VITAMIN D3) 1000 UNITS CAPS Take by mouth.    Marland Kitchen CINNAMON PO Take 2,000 mg by mouth.    Marland Kitchen glycopyrrolate (ROBINUL) 2 MG tablet     . losartan-hydrochlorothiazide (HYZAAR) 100-25 MG per tablet     . Turmeric 450 MG CAPS Take by  mouth.    Marland Kitchen VITAMIN E PO Take 90 mg by mouth.    . Zinc 50 MG CAPS Take by mouth.     No current facility-administered medications for this visit.    OBJECTIVE: There were no vitals filed for this visit.   There is no height or weight on file to calculate BMI.    ECOG FS:0 - Asymptomatic  General: Well-developed, well-nourished, no acute distress. HEENT: Normocephalic. Neuro: Alert, answering all questions appropriately. Cranial nerves grossly intact. Psych: Normal affect.   LAB RESULTS:  Lab Results  Component Value Date   NA 136 12/09/2013   K 3.1 (L) 12/09/2013   CL 99 12/09/2013   CO2 28 12/09/2013   GLUCOSE 107 (H) 12/09/2013   BUN 12 12/09/2013   CREATININE 0.69 12/09/2013   CALCIUM 9.2 12/09/2013   PROT 6.9 12/09/2013   ALBUMIN 3.7 12/09/2013   AST 18 12/09/2013   ALT 25 12/09/2013   ALKPHOS 66 12/09/2013   BILITOT 0.5 12/09/2013   GFRNONAA >60 12/09/2013   GFRAA >60 12/09/2013    Lab Results  Component Value Date   WBC 7.2 12/09/2013   NEUTROABS 4.4 12/09/2013   HGB 13.9 12/09/2013   HCT 40.4 12/09/2013   MCV 93 12/09/2013   PLT 232 12/09/2013   Lab Results  Component Value Date   LABCA2 13.5 11/17/2014     STUDIES: No results found.  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  BRCA negative.  PLAN:    1.  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 BRCA negative: Patient underwent neoadjuvant chemotherapy followed by total mastectomy on June 13, 2013.  Final pathology results revealed residual DCIS without invasive component and 0/4 lymph nodes negative for disease.  She did not require adjuvant XRT.  Patient's most recent CA 27-29 in February 2021 was within normal limits.  Although initial plan was to take tamoxifen for extended time in a total of 7 years, patient discontinued treatment after  being diagnosed with DVT.  She completed over 6 years of treatment.  Her most recent mammogram on January 12, 2020 was reported as BI-RADS 2.  Repeat in February 2022.  No further intervention is needed at this time.  Return to clinic in 1 year with video assisted telemedicine visit.  2.  Melanoma: Unclear stage.  Patient reports that she had complete surgical resection. 3.  DVT: Likely secondary to postoperative immobility.  Patient completed 3 months of Eliquis and has discontinued tamoxifen as above.  No further intervention is needed.   I provided 20 minutes of face-to-face video visit time during this encounter which included chart review, counseling, and coordination of care as documented above.   Patient expressed understanding and was in agreement with this plan. She also understands that She can call clinic at any time with any questions, concerns, or complaints.    Lloyd Huger, MD   04/26/2020 4:34  PM

## 2020-04-26 ENCOUNTER — Inpatient Hospital Stay: Payer: 59 | Attending: Oncology | Admitting: Oncology

## 2020-04-26 ENCOUNTER — Encounter: Payer: Self-pay | Admitting: Oncology

## 2020-04-26 ENCOUNTER — Other Ambulatory Visit: Payer: Self-pay

## 2020-04-26 DIAGNOSIS — C50411 Malignant neoplasm of upper-outer quadrant of right female breast: Secondary | ICD-10-CM | POA: Diagnosis not present

## 2020-04-26 DIAGNOSIS — E785 Hyperlipidemia, unspecified: Secondary | ICD-10-CM | POA: Insufficient documentation

## 2020-04-26 NOTE — Progress Notes (Signed)
Patient prescreened for appointment. Patient has no concerns or questions.  

## 2021-02-01 ENCOUNTER — Other Ambulatory Visit: Payer: Self-pay

## 2021-02-01 ENCOUNTER — Ambulatory Visit
Admission: RE | Admit: 2021-02-01 | Discharge: 2021-02-01 | Disposition: A | Discharge status: Home / Self Care | Payer: 59 | Source: Ambulatory Visit | Attending: Oncology | Admitting: Oncology

## 2021-02-01 DIAGNOSIS — C50411 Malignant neoplasm of upper-outer quadrant of right female breast: Secondary | ICD-10-CM

## 2021-04-12 ENCOUNTER — Other Ambulatory Visit: Payer: Self-pay

## 2021-04-18 NOTE — Progress Notes (Signed)
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  BRCA negative  INTERVAL HISTORY: Patient returns to clinic today for routine yearly evaluation.  She currently feels well and is asymptomatic. She has no neurologic complaints. She denies any peripheral neuropathy.  She denies any recent fevers or illnesses.  She has good appetite and denies weight loss.  She denies any chest pain, shortness of breath, cough, or hemoptysis.  She denies any nausea, vomiting, constipation, or diarrhea.  She has no urinary complaints.  Patient offers no specific complaints today.  REVIEW OF SYSTEMS:   Review of Systems  Constitutional: Negative.  Negative for fever and malaise/fatigue.  Respiratory: Negative.  Negative for cough and shortness of breath.   Cardiovascular: Negative.  Negative for chest pain and leg swelling.  Gastrointestinal: Negative.  Negative for abdominal pain.  Genitourinary: Negative.  Negative for dysuria.  Musculoskeletal: Negative.  Negative for back pain.  Skin: Negative.  Negative for rash.  Neurological: Negative.  Negative for sensory change, focal weakness, weakness and headaches.  Psychiatric/Behavioral: Negative.  The patient is not nervous/anxious.     As per HPI. Otherwise, a complete review of systems is negative.  PAST MEDICAL HISTORY: Past Medical History:  Diagnosis Date  . ADD (attention deficit disorder)   . Breast cancer [facility]) 2013   right breast with lumpectomy and chemo and rad  . Hyperlipidemia   . Hypertension   . Personal history of chemotherapy 2014   Right breast ca  . Personal history of  radiation therapy 2014   right breast ca    PAST SURGICAL HISTORY: Past Surgical History:  Procedure Laterality Date  . ABDOMINAL HYSTERECTOMY    . BREAST BIOPSY Right 2013   DCIS  . BREAST LUMPECTOMY Right 2013   DCIS    FAMILY HISTORY: Paternal grandmother with breast cancer, hypertension.     ADVANCED DIRECTIVES:    HEALTH MAINTENANCE: Social History   Tobacco Use  . Smoking status: Former Research scientist (life sciences)  . Smokeless tobacco: Never Used  Vaping Use  . Vaping Use: Never used  Substance Use Topics  . Alcohol use: No  . Drug use: No     Allergies  Allergen Reactions  . No Known Allergies     Current Outpatient Medications  Medication Sig Dispense Refill  . amphetamine-dextroamphetamine (ADDERALL) 20 MG tablet TK 1 T PO BID  0  . APPLE CIDER VINEGAR PO Take 450 mg by mouth.    Marland Kitchen BIOTIN 5000 PO Take 1 Dose by mouth.    . Cholecalciferol (VITAMIN D3) 1000 UNITS CAPS Take by mouth.    Marland Kitchen CINNAMON PO Take 2,000 mg by mouth.    Marland Kitchen glycopyrrolate (ROBINUL) 2 MG tablet     . losartan-hydrochlorothiazide (HYZAAR) 100-25 MG per tablet     . Turmeric 450 MG CAPS Take by mouth.    . Zinc 50 MG CAPS Take by mouth.    Marland Kitchen apixaban (ELIQUIS) 5 MG TABS tablet Take 5 mg by mouth 2 (two) times daily. (Patient not taking: Reported on 04/23/2021)    . VITAMIN E PO Take 90 mg by mouth. (Patient not taking: Reported on 04/23/2021)  No current facility-administered medications for this visit.    OBJECTIVE: Vitals:   04/23/21 1429  BP: 117/77  Pulse: 78  Resp: 17  Temp: 97.9 F (36.6 C)  SpO2: 100%     Body mass index is 26.89 kg/m.    ECOG FS:0 - Asymptomatic  General: Well-developed, well-nourished, no acute distress. Eyes: Pink conjunctiva, anicteric sclera. HEENT: Normocephalic, moist mucous membranes. Breasts: Exam deferred today. Lungs: No audible wheezing or coughing. Heart: Regular rate and rhythm. Abdomen: Soft, nontender, no obvious distention. Musculoskeletal: No  edema, cyanosis, or clubbing. Neuro: Alert, answering all questions appropriately. Cranial nerves grossly intact. Skin: No rashes or petechiae noted. Psych: Normal affect.   LAB RESULTS:  Lab Results  Component Value Date   NA 136 12/09/2013   K 3.1 (L) 12/09/2013   CL 99 12/09/2013   CO2 28 12/09/2013   GLUCOSE 107 (H) 12/09/2013   BUN 12 12/09/2013   CREATININE 0.69 12/09/2013   CALCIUM 9.2 12/09/2013   PROT 6.9 12/09/2013   ALBUMIN 3.7 12/09/2013   AST 18 12/09/2013   ALT 25 12/09/2013   ALKPHOS 66 12/09/2013   BILITOT 0.5 12/09/2013   GFRNONAA >60 12/09/2013   GFRAA >60 12/09/2013    Lab Results  Component Value Date   WBC 7.2 12/09/2013   NEUTROABS 4.4 12/09/2013   HGB 13.9 12/09/2013   HCT 40.4 12/09/2013   MCV 93 12/09/2013   PLT 232 12/09/2013   Lab Results  Component Value Date   LABCA2 13.5 11/17/2014     STUDIES: No results found.  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  BRCA negative.  PLAN:    1.  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 BRCA negative: Patient underwent neoadjuvant chemotherapy followed by partial mastectomy on June 13, 2013.  Final pathology results revealed residual DCIS without invasive component and 0/4 lymph nodes negative for disease.  She did not require adjuvant XRT.  Patient's most recent CA 27-29 in February 2021 was within normal limits.  Today's result is pending.  Although initial plan was to take tamoxifen for extended time in a total of 7 years, patient discontinued treatment after being diagnosed with DVT.  She completed over 6 years of treatment.  Her most recent mammogram on February 01, 2021 was reported as BI-RADS 2, repeat in March 2023.  Return to clinic in 1 year with video assisted telemedicine visit. 2.  Melanoma: Unclear stage.  Patient reports that she  had complete surgical resection. 3.  DVT: Likely secondary to postoperative immobility.  Patient completed 3 months of Eliquis and has discontinued tamoxifen as above.  No further intervention is needed.  I spent a total of 20 minutes reviewing chart data, face-to-face evaluation with the patient, counseling and coordination of care as detailed above.    Patient expressed understanding and was in agreement with this plan. She also understands that She can call clinic at any time with any questions, concerns, or complaints.    Lloyd Huger, MD   04/23/2021 4:07 PM

## 2021-04-22 ENCOUNTER — Ambulatory Visit: Payer: 59 | Admitting: Oncology

## 2021-04-23 ENCOUNTER — Other Ambulatory Visit: Payer: Self-pay

## 2021-04-23 ENCOUNTER — Inpatient Hospital Stay: Payer: 59 | Attending: Oncology | Admitting: Oncology

## 2021-04-23 ENCOUNTER — Encounter: Payer: Self-pay | Admitting: Oncology

## 2021-04-23 VITALS — BP 117/77 | HR 78 | Temp 97.9°F | Resp 17 | Wt 159.6 lb

## 2021-04-23 DIAGNOSIS — Z87891 Personal history of nicotine dependence: Secondary | ICD-10-CM | POA: Insufficient documentation

## 2021-04-23 DIAGNOSIS — Z853 Personal history of malignant neoplasm of breast: Secondary | ICD-10-CM | POA: Diagnosis present

## 2021-04-23 DIAGNOSIS — Z86718 Personal history of other venous thrombosis and embolism: Secondary | ICD-10-CM | POA: Diagnosis not present

## 2021-04-23 DIAGNOSIS — C50411 Malignant neoplasm of upper-outer quadrant of right female breast: Secondary | ICD-10-CM

## 2021-04-23 DIAGNOSIS — Z1231 Encounter for screening mammogram for malignant neoplasm of breast: Secondary | ICD-10-CM | POA: Diagnosis not present

## 2021-04-30 ENCOUNTER — Encounter: Payer: Self-pay | Admitting: Oncology

## 2021-05-01 ENCOUNTER — Encounter: Payer: Self-pay | Admitting: Oncology

## 2022-02-21 ENCOUNTER — Other Ambulatory Visit: Payer: Self-pay

## 2022-02-21 ENCOUNTER — Ambulatory Visit
Admission: RE | Admit: 2022-02-21 | Discharge: 2022-02-21 | Disposition: A | Discharge status: Home / Self Care | Payer: 59 | Source: Ambulatory Visit | Attending: Oncology | Admitting: Oncology

## 2022-02-21 DIAGNOSIS — Z1231 Encounter for screening mammogram for malignant neoplasm of breast: Secondary | ICD-10-CM | POA: Insufficient documentation

## 2022-04-20 NOTE — Progress Notes (Signed)
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  BRCA negative  INTERVAL HISTORY: Patient returns to clinic today for routine yearly evaluation.  She continues to feel well and remains asymptomatic. She has no neurologic complaints. She denies any peripheral neuropathy.  She denies any recent fevers or illnesses.  She has a good appetite and denies weight loss.  She denies any chest pain, shortness of breath, cough, or hemoptysis.  She denies any nausea, vomiting, constipation, or diarrhea.  She has no urinary complaints.  Patient feels at her baseline offers no specific complaints today.  REVIEW OF SYSTEMS:   Review of Systems  Constitutional: Negative.  Negative for fever and malaise/fatigue.  Respiratory: Negative.  Negative for cough and shortness of breath.   Cardiovascular: Negative.  Negative for chest pain and leg swelling.  Gastrointestinal: Negative.  Negative for abdominal pain.  Genitourinary: Negative.  Negative for dysuria.  Musculoskeletal: Negative.  Negative for back pain.  Skin: Negative.  Negative for rash.  Neurological: Negative.  Negative for sensory change, focal weakness, weakness and headaches.  Psychiatric/Behavioral: Negative.  The patient is not nervous/anxious.    As per HPI. Otherwise, a complete review of systems is negative.  PAST MEDICAL HISTORY: Past Medical History:  Diagnosis Date   ADD (attention deficit disorder)    Breast cancer ([place]) 2013   right breast with lumpectomy and chemo and rad   Hyperlipidemia    Hypertension    Personal history of chemotherapy 2014   Right breast ca    Personal history of radiation therapy 2014   right breast ca    PAST SURGICAL HISTORY: Past Surgical History:  Procedure Laterality Date   ABDOMINAL HYSTERECTOMY     BREAST BIOPSY Right 2013   DCIS   BREAST LUMPECTOMY Right 2013   DCIS    FAMILY HISTORY: Paternal grandmother with breast cancer, hypertension.     ADVANCED DIRECTIVES:    HEALTH MAINTENANCE: Social History   Tobacco Use   Smoking status: Former   Smokeless tobacco: Never  Scientific laboratory technician Use: Never used  Substance Use Topics   Alcohol use: No   Drug use: No     Allergies  Allergen Reactions   No Known Allergies     Current Outpatient Medications  Medication Sig Dispense Refill   amphetamine-dextroamphetamine (ADDERALL) 20 MG tablet TK 1 T PO BID  0   APPLE CIDER VINEGAR PO Take 450 mg by mouth.     BIOTIN 5000 PO Take 1 Dose by mouth.     Cholecalciferol (VITAMIN D3) 1000 UNITS CAPS Take by mouth.     CINNAMON PO Take 2,000 mg by mouth.     COLLAGEN PO Take by mouth. Liquid form     glycopyrrolate (ROBINUL) 2 MG tablet      losartan-hydrochlorothiazide (HYZAAR) 100-25 MG per tablet      Turmeric 450 MG CAPS Take by mouth.     VITAMIN E PO Take 90 mg by mouth.     Zinc 50 MG CAPS Take by mouth.     apixaban (ELIQUIS) 5 MG TABS tablet Take 5 mg by mouth 2 (two) times daily. (Patient not  taking: Reported on 04/23/2021)     No current facility-administered medications for this visit.    OBJECTIVE: Vitals:   04/24/22 1426  BP: 111/66  Pulse: 85  Resp: 16  Temp: (!) 96.4 F (35.8 C)  SpO2: 100%     Body mass index is 24.88 kg/m.    ECOG FS:0 - Asymptomatic  General: Well-developed, well-nourished, no acute distress. Eyes: Pink conjunctiva, anicteric sclera. HEENT: Normocephalic, moist mucous membranes. Breast: Exam deferred today. Lungs: No audible wheezing or coughing. Heart: Regular rate and rhythm. Abdomen: Soft, nontender, no obvious distention. Musculoskeletal: No edema,  cyanosis, or clubbing. Neuro: Alert, answering all questions appropriately. Cranial nerves grossly intact. Skin: No rashes or petechiae noted. Psych: Normal affect.   LAB RESULTS:  Lab Results  Component Value Date   NA 136 12/09/2013   K 3.1 (L) 12/09/2013   CL 99 12/09/2013   CO2 28 12/09/2013   GLUCOSE 107 (H) 12/09/2013   BUN 12 12/09/2013   CREATININE 0.69 12/09/2013   CALCIUM 9.2 12/09/2013   PROT 6.9 12/09/2013   ALBUMIN 3.7 12/09/2013   AST 18 12/09/2013   ALT 25 12/09/2013   ALKPHOS 66 12/09/2013   BILITOT 0.5 12/09/2013   GFRNONAA >60 12/09/2013   GFRAA >60 12/09/2013    Lab Results  Component Value Date   WBC 7.2 12/09/2013   NEUTROABS 4.4 12/09/2013   HGB 13.9 12/09/2013   HCT 40.4 12/09/2013   MCV 93 12/09/2013   PLT 232 12/09/2013   Lab Results  Component Value Date   LABCA2 13.5 11/17/2014     STUDIES: No results found.  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  BRCA negative.  PLAN:    1.  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 BRCA negative: Patient underwent neoadjuvant chemotherapy followed by partial mastectomy on June 13, 2013.  Final pathology results revealed residual DCIS without invasive component and 0/4 lymph nodes negative for disease.  She did not require adjuvant XRT.  Patient's most recent CA 27-29 continues to be normal limits.  Today's result is pending.  Although initial plan was to take tamoxifen for extended time in a total of 7 years, patient discontinued treatment after being diagnosed with DVT.  She completed over 6 years of treatment.  Her most recent mammogram on February 21, 2022 was reported as BI-RADS 1.  Repeat in March 2024.  Return to clinic in 1 year for routine evaluation, at which point patient will be 10 years removed from treatment and can likely be discharged  from clinic.   2.  Melanoma: Unclear stage.  Right lower leg.  Patient reports that she had complete surgical resection. 3.  DVT: Likely secondary to postoperative immobility.  Patient completed 3 months of Eliquis and has discontinued tamoxifen as above.  No further intervention is needed.  I spent a total of 20 minutes reviewing chart data, face-to-face evaluation with the patient, counseling and coordination of care as detailed above.   Patient expressed understanding and was in agreement with this plan. She also understands that She can call clinic at any time with any questions, concerns, or complaints.    Lloyd Huger, MD   04/25/2022 9:32 AM

## 2022-04-24 ENCOUNTER — Inpatient Hospital Stay: Payer: 59 | Attending: Oncology | Admitting: Oncology

## 2022-04-24 ENCOUNTER — Encounter: Payer: Self-pay | Admitting: Oncology

## 2022-04-24 VITALS — BP 111/66 | HR 85 | Temp 96.4°F | Resp 16 | Ht 64.6 in | Wt 147.7 lb

## 2022-04-24 DIAGNOSIS — Z86000 Personal history of in-situ neoplasm of breast: Secondary | ICD-10-CM | POA: Diagnosis present

## 2022-04-24 DIAGNOSIS — Z08 Encounter for follow-up examination after completed treatment for malignant neoplasm: Secondary | ICD-10-CM | POA: Diagnosis not present

## 2022-04-24 DIAGNOSIS — Z85820 Personal history of malignant melanoma of skin: Secondary | ICD-10-CM | POA: Diagnosis not present

## 2022-04-24 DIAGNOSIS — C50411 Malignant neoplasm of upper-outer quadrant of right female breast: Secondary | ICD-10-CM

## 2022-04-24 DIAGNOSIS — Z7901 Long term (current) use of anticoagulants: Secondary | ICD-10-CM | POA: Insufficient documentation

## 2022-04-24 DIAGNOSIS — Z86718 Personal history of other venous thrombosis and embolism: Secondary | ICD-10-CM | POA: Diagnosis not present

## 2022-04-25 ENCOUNTER — Encounter: Payer: Self-pay | Admitting: Oncology

## 2023-02-23 ENCOUNTER — Ambulatory Visit
Admission: RE | Admit: 2023-02-23 | Discharge: 2023-02-23 | Disposition: A | Discharge status: Home / Self Care | Payer: 59 | Source: Ambulatory Visit | Attending: Oncology | Admitting: Oncology

## 2023-02-23 DIAGNOSIS — C50411 Malignant neoplasm of upper-outer quadrant of right female breast: Secondary | ICD-10-CM

## 2023-02-23 DIAGNOSIS — Z1231 Encounter for screening mammogram for malignant neoplasm of breast: Secondary | ICD-10-CM | POA: Diagnosis present

## 2023-02-23 DIAGNOSIS — Z853 Personal history of malignant neoplasm of breast: Secondary | ICD-10-CM | POA: Diagnosis not present

## 2023-04-28 ENCOUNTER — Inpatient Hospital Stay: Payer: 59 | Attending: Oncology | Admitting: Oncology

## 2023-04-28 ENCOUNTER — Encounter: Payer: Self-pay | Admitting: Oncology

## 2023-04-28 VITALS — BP 119/78 | HR 75 | Temp 97.5°F | Resp 16 | Ht 64.6 in | Wt 154.0 lb

## 2023-04-28 DIAGNOSIS — Z86718 Personal history of other venous thrombosis and embolism: Secondary | ICD-10-CM | POA: Diagnosis not present

## 2023-04-28 DIAGNOSIS — D0511 Intraductal carcinoma in situ of right breast: Secondary | ICD-10-CM | POA: Diagnosis present

## 2023-04-28 DIAGNOSIS — Z170 Estrogen receptor positive status [ER+]: Secondary | ICD-10-CM | POA: Insufficient documentation

## 2023-04-28 DIAGNOSIS — C50411 Malignant neoplasm of upper-outer quadrant of right female breast: Secondary | ICD-10-CM | POA: Diagnosis not present

## 2023-04-28 MED ORDER — DOXYCYCLINE HYCLATE 100 MG PO TABS: 100.0000 mg | ORAL_TABLET | Freq: Two times a day (BID) | ORAL | 0 refills | Status: AC

## 2023-04-28 NOTE — Progress Notes (Signed)
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  BRCA negative  INTERVAL HISTORY: Patient returns to clinic today for routine yearly evaluation.  Over the past several days she noted erythema on her right upper arm that is pruritic.  She does not recall any bug bites or injuries to this area.  She otherwise feels well and is asymptomatic.  She has no neurologic complaints. She denies any peripheral neuropathy.  She denies any recent fevers or illnesses.  She has a good appetite and denies weight loss.  She denies any chest pain, shortness of breath, cough, or hemoptysis.  She denies any nausea, vomiting, constipation, or diarrhea.  She has no urinary complaints.  Patient offers no further specific complaints today.  REVIEW OF SYSTEMS:   Review of Systems  Constitutional: Negative.  Negative for fever and malaise/fatigue.  Respiratory: Negative.  Negative for cough and shortness of breath.   Cardiovascular: Negative.  Negative for chest pain and leg swelling.  Gastrointestinal: Negative.  Negative for abdominal pain.  Genitourinary: Negative.  Negative for dysuria.  Musculoskeletal: Negative.  Negative for back pain.  Skin:  Positive for itching. Negative for rash.  Neurological: Negative.  Negative for sensory change, focal weakness, weakness and headaches.  Psychiatric/Behavioral: Negative.  The patient is not nervous/anxious.     As per HPI. Otherwise, a complete review of systems is negative.  PAST MEDICAL HISTORY: Past Medical History:  Diagnosis Date   ADD (attention deficit disorder)    Breast cancer (HCC) 2013    right breast with lumpectomy and chemo and rad   Hyperlipidemia    Hypertension    Personal history of chemotherapy 2014   Right breast ca   Personal history of radiation therapy 2014   right breast ca    PAST SURGICAL HISTORY: Past Surgical History:  Procedure Laterality Date   ABDOMINAL HYSTERECTOMY     BREAST BIOPSY Right 2013   DCIS   BREAST LUMPECTOMY Right 2013   DCIS    FAMILY HISTORY: Paternal grandmother with breast cancer, hypertension.     ADVANCED DIRECTIVES:    HEALTH MAINTENANCE: Social History   Tobacco Use   Smoking status: Former   Smokeless tobacco: Never  Building services engineer Use: Never used  Substance Use Topics   Alcohol use: No   Drug use: No     Allergies  Allergen Reactions   No Known Allergies     Current Outpatient Medications  Medication Sig Dispense Refill   amphetamine-dextroamphetamine (ADDERALL) 20 MG tablet TK 1 T PO BID  0   apixaban (ELIQUIS) 5 MG TABS tablet Take 5 mg by mouth 2 (two) times daily.     APPLE CIDER VINEGAR PO Take 450 mg by mouth.     BIOTIN 5000 PO Take 1 Dose by mouth.     Cholecalciferol (VITAMIN D3) 1000 UNITS CAPS Take by mouth.     CINNAMON PO Take 2,000 mg by mouth.     COLLAGEN PO Take by mouth. Liquid form     glycopyrrolate (ROBINUL) 2 MG tablet      losartan-hydrochlorothiazide (HYZAAR) 100-25 MG per tablet      Turmeric 450  MG CAPS Take by mouth.     VITAMIN E PO Take 90 mg by mouth.     Zinc 50 MG CAPS Take by mouth.     No current facility-administered medications for this visit.    OBJECTIVE: Vitals:   04/28/23 1405  BP: 119/78  Pulse: 75  Resp: 16  Temp: (!) 97.5 F (36.4 C)  SpO2: 100%     Body mass index is 25.95 kg/m.    ECOG FS:0 - Asymptomatic  General: Well-developed, well-nourished, no acute distress. Eyes: Pink conjunctiva, anicteric sclera. HEENT: Normocephalic, moist mucous membranes. Lungs: No audible wheezing or coughing. Heart: Regular rate and rhythm. Abdomen:  Soft, nontender, no obvious distention. Musculoskeletal: No edema, cyanosis, or clubbing. Neuro: Alert, answering all questions appropriately. Cranial nerves grossly intact. Skin: Area of erythema warm to touch on right upper arm. Psych: Normal affect.    LAB RESULTS:  Lab Results  Component Value Date   NA 136 12/09/2013   K 3.1 (L) 12/09/2013   CL 99 12/09/2013   CO2 28 12/09/2013   GLUCOSE 107 (H) 12/09/2013   BUN 12 12/09/2013   CREATININE 0.69 12/09/2013   CALCIUM 9.2 12/09/2013   PROT 6.9 12/09/2013   ALBUMIN 3.7 12/09/2013   AST 18 12/09/2013   ALT 25 12/09/2013   ALKPHOS 66 12/09/2013   BILITOT 0.5 12/09/2013   GFRNONAA >60 12/09/2013   GFRAA >60 12/09/2013    Lab Results  Component Value Date   WBC 7.2 12/09/2013   NEUTROABS 4.4 12/09/2013   HGB 13.9 12/09/2013   HCT 40.4 12/09/2013   MCV 93 12/09/2013   PLT 232 12/09/2013   Lab Results  Component Value Date   LABCA2 13.5 11/17/2014     STUDIES: No results found.  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  BRCA negative.  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 BRCA negative: Patient underwent neoadjuvant chemotherapy followed by partial mastectomy on June 13, 2013.  Final pathology results revealed residual DCIS without invasive component and 0/4 lymph nodes negative for disease.  She did not require adjuvant XRT.  Patient's most recent CA 27-29 continues to be normal limits.  Today's result is pending.  Although initial plan was to take tamoxifen for extended time in a total of 7 years, patient discontinued treatment after being diagnosed with DVT.  She completed over 6 years of treatment.  Her most recent mammogram on February 23, 2023 was reported as BI-RADS 1.  Repeat in March 2025.  Patient is now 10 years removed from treatment and can  be discharged from clinic.  She has agreed that her primary care physician can continue ordering her yearly screening mammogram.   Melanoma: Unclear stage.  Right lower leg.  Patient underwent surgical resection at [facility].   DVT: Likely secondary to postoperative immobility.  Patient completed 3 months of Eliquis and has discontinued tamoxifen as above.  No further intervention is needed. Possible cellulitis: Right upper arm.  Patient was given a prescription for doxycycline today.  If the area becomes worse, she has been instructed to seek further medical attention.    Patient expressed understanding and was in agreement with this plan. She also understands that She can call clinic at any time with any questions, concerns, or complaints.    Jeralyn Ruths, MD  04/28/2023 2:30 PM

## 2023-04-28 NOTE — Progress Notes (Signed)
Wants place on right arm and near left eye looked at. Having itching in both areas.

## 2024-02-16 ENCOUNTER — Encounter: Payer: Self-pay | Admitting: Family Medicine

## 2024-02-19 ENCOUNTER — Other Ambulatory Visit: Payer: Self-pay | Admitting: Family Medicine

## 2024-02-19 DIAGNOSIS — E785 Hyperlipidemia, unspecified: Secondary | ICD-10-CM

## 2024-02-25 ENCOUNTER — Ambulatory Visit
Admission: RE | Admit: 2024-02-25 | Discharge: 2024-02-25 | Disposition: A | Discharge status: Home / Self Care | Payer: Self-pay | Source: Ambulatory Visit | Attending: Family Medicine | Admitting: Family Medicine

## 2024-02-25 DIAGNOSIS — E785 Hyperlipidemia, unspecified: Secondary | ICD-10-CM | POA: Insufficient documentation

## 2024-03-10 ENCOUNTER — Other Ambulatory Visit: Payer: Self-pay | Admitting: Obstetrics and Gynecology

## 2024-03-10 DIAGNOSIS — Z1231 Encounter for screening mammogram for malignant neoplasm of breast: Secondary | ICD-10-CM

## 2024-03-16 ENCOUNTER — Other Ambulatory Visit: Payer: Self-pay | Admitting: Obstetrics and Gynecology

## 2024-03-16 DIAGNOSIS — Z1231 Encounter for screening mammogram for malignant neoplasm of breast: Secondary | ICD-10-CM

## 2024-03-29 ENCOUNTER — Ambulatory Visit
Admission: RE | Admit: 2024-03-29 | Discharge: 2024-03-29 | Disposition: A | Discharge status: Home / Self Care | Payer: Self-pay | Source: Ambulatory Visit | Attending: Obstetrics and Gynecology | Admitting: Obstetrics and Gynecology

## 2024-03-29 DIAGNOSIS — Z1231 Encounter for screening mammogram for malignant neoplasm of breast: Secondary | ICD-10-CM | POA: Insufficient documentation

## 2024-05-06 ENCOUNTER — Ambulatory Visit

## 2024-05-06 DIAGNOSIS — Z1211 Encounter for screening for malignant neoplasm of colon: Secondary | ICD-10-CM | POA: Diagnosis present

## 2024-05-06 DIAGNOSIS — K5730 Diverticulosis of large intestine without perforation or abscess without bleeding: Secondary | ICD-10-CM | POA: Diagnosis not present

## 2024-05-06 DIAGNOSIS — K640 First degree hemorrhoids: Secondary | ICD-10-CM | POA: Diagnosis not present
